# Patient Record
Sex: Male | Born: 1935 | Race: White | Hispanic: No | Smoking: Former smoker
Health system: Southern US, Community
[De-identification: ages and names within clinical notes are randomized; demographics above are authoritative.]

## PROBLEM LIST (undated history)

## (undated) DIAGNOSIS — K219 Gastro-esophageal reflux disease without esophagitis: Secondary | ICD-10-CM

## (undated) DIAGNOSIS — IMO0001 Reserved for inherently not codable concepts without codable children: Secondary | ICD-10-CM

## (undated) DIAGNOSIS — E785 Hyperlipidemia, unspecified: Secondary | ICD-10-CM

## (undated) DIAGNOSIS — N400 Enlarged prostate without lower urinary tract symptoms: Secondary | ICD-10-CM

## (undated) DIAGNOSIS — I493 Ventricular premature depolarization: Secondary | ICD-10-CM

## (undated) DIAGNOSIS — I10 Essential (primary) hypertension: Secondary | ICD-10-CM

## (undated) DIAGNOSIS — H9190 Unspecified hearing loss, unspecified ear: Secondary | ICD-10-CM

## (undated) HISTORY — PX: OTHER SURGICAL HISTORY: SHX169

## (undated) HISTORY — DX: Essential (primary) hypertension: I10

## (undated) HISTORY — DX: Benign prostatic hyperplasia without lower urinary tract symptoms: N40.0

## (undated) HISTORY — PX: CATARACT EXTRACTION, BILATERAL: SHX1313

## (undated) HISTORY — DX: Gastro-esophageal reflux disease without esophagitis: K21.9

## (undated) HISTORY — DX: Ventricular premature depolarization: I49.3

## (undated) HISTORY — PX: HAND SURGERY: SHX662

## (undated) HISTORY — DX: Reserved for inherently not codable concepts without codable children: IMO0001

## (undated) HISTORY — DX: Hyperlipidemia, unspecified: E78.5

## (undated) HISTORY — PX: TONSILLECTOMY: SUR1361

---

## 2012-01-24 DIAGNOSIS — R141 Gas pain: Secondary | ICD-10-CM | POA: Diagnosis not present

## 2012-01-24 DIAGNOSIS — Z86010 Personal history of colonic polyps: Secondary | ICD-10-CM | POA: Diagnosis not present

## 2012-01-24 DIAGNOSIS — R143 Flatulence: Secondary | ICD-10-CM | POA: Diagnosis not present

## 2012-01-24 DIAGNOSIS — R198 Other specified symptoms and signs involving the digestive system and abdomen: Secondary | ICD-10-CM | POA: Diagnosis not present

## 2012-01-28 DIAGNOSIS — R142 Eructation: Secondary | ICD-10-CM | POA: Diagnosis not present

## 2012-01-28 DIAGNOSIS — R143 Flatulence: Secondary | ICD-10-CM | POA: Diagnosis not present

## 2012-01-28 DIAGNOSIS — R141 Gas pain: Secondary | ICD-10-CM | POA: Diagnosis not present

## 2012-02-14 DIAGNOSIS — R198 Other specified symptoms and signs involving the digestive system and abdomen: Secondary | ICD-10-CM | POA: Diagnosis not present

## 2012-02-14 DIAGNOSIS — K219 Gastro-esophageal reflux disease without esophagitis: Secondary | ICD-10-CM | POA: Diagnosis not present

## 2012-02-14 DIAGNOSIS — R141 Gas pain: Secondary | ICD-10-CM | POA: Diagnosis not present

## 2012-02-14 DIAGNOSIS — Z86010 Personal history of colonic polyps: Secondary | ICD-10-CM | POA: Diagnosis not present

## 2012-03-02 DIAGNOSIS — H26499 Other secondary cataract, unspecified eye: Secondary | ICD-10-CM | POA: Diagnosis not present

## 2012-03-02 DIAGNOSIS — Z961 Presence of intraocular lens: Secondary | ICD-10-CM | POA: Diagnosis not present

## 2012-03-06 DIAGNOSIS — E7800 Pure hypercholesterolemia, unspecified: Secondary | ICD-10-CM | POA: Diagnosis not present

## 2012-03-06 DIAGNOSIS — K648 Other hemorrhoids: Secondary | ICD-10-CM | POA: Diagnosis not present

## 2012-03-06 DIAGNOSIS — K5730 Diverticulosis of large intestine without perforation or abscess without bleeding: Secondary | ICD-10-CM | POA: Diagnosis not present

## 2012-03-06 DIAGNOSIS — R198 Other specified symptoms and signs involving the digestive system and abdomen: Secondary | ICD-10-CM | POA: Diagnosis not present

## 2012-03-06 DIAGNOSIS — K644 Residual hemorrhoidal skin tags: Secondary | ICD-10-CM | POA: Diagnosis not present

## 2012-03-06 DIAGNOSIS — Z09 Encounter for follow-up examination after completed treatment for conditions other than malignant neoplasm: Secondary | ICD-10-CM | POA: Diagnosis not present

## 2012-03-06 DIAGNOSIS — N400 Enlarged prostate without lower urinary tract symptoms: Secondary | ICD-10-CM | POA: Diagnosis not present

## 2012-03-06 DIAGNOSIS — D126 Benign neoplasm of colon, unspecified: Secondary | ICD-10-CM | POA: Diagnosis not present

## 2012-03-06 DIAGNOSIS — Z860100 Personal history of colon polyps, unspecified: Secondary | ICD-10-CM | POA: Diagnosis not present

## 2012-03-06 DIAGNOSIS — K219 Gastro-esophageal reflux disease without esophagitis: Secondary | ICD-10-CM | POA: Diagnosis not present

## 2012-03-06 DIAGNOSIS — Z5181 Encounter for therapeutic drug level monitoring: Secondary | ICD-10-CM | POA: Diagnosis not present

## 2012-03-06 DIAGNOSIS — I10 Essential (primary) hypertension: Secondary | ICD-10-CM | POA: Diagnosis not present

## 2012-03-06 DIAGNOSIS — Z87891 Personal history of nicotine dependence: Secondary | ICD-10-CM | POA: Diagnosis not present

## 2012-05-29 DIAGNOSIS — I10 Essential (primary) hypertension: Secondary | ICD-10-CM | POA: Diagnosis not present

## 2012-05-29 DIAGNOSIS — Z111 Encounter for screening for respiratory tuberculosis: Secondary | ICD-10-CM | POA: Diagnosis not present

## 2012-05-29 DIAGNOSIS — E119 Type 2 diabetes mellitus without complications: Secondary | ICD-10-CM | POA: Diagnosis not present

## 2012-05-29 DIAGNOSIS — E782 Mixed hyperlipidemia: Secondary | ICD-10-CM | POA: Diagnosis not present

## 2012-06-01 DIAGNOSIS — I10 Essential (primary) hypertension: Secondary | ICD-10-CM | POA: Diagnosis not present

## 2012-06-01 DIAGNOSIS — Z0000 Encounter for general adult medical examination without abnormal findings: Secondary | ICD-10-CM | POA: Diagnosis not present

## 2012-06-01 DIAGNOSIS — E782 Mixed hyperlipidemia: Secondary | ICD-10-CM | POA: Diagnosis not present

## 2012-08-16 ENCOUNTER — Ambulatory Visit (INDEPENDENT_AMBULATORY_CARE_PROVIDER_SITE_OTHER): Payer: BC Managed Care – PPO | Admitting: Sports Medicine

## 2012-08-16 ENCOUNTER — Encounter: Payer: Self-pay | Admitting: Sports Medicine

## 2012-08-16 VITALS — BP 108/70 | HR 83 | Temp 98.3°F | Resp 18 | Ht 73.0 in | Wt 180.0 lb

## 2012-08-16 DIAGNOSIS — Z0000 Encounter for general adult medical examination without abnormal findings: Secondary | ICD-10-CM | POA: Insufficient documentation

## 2012-08-16 DIAGNOSIS — K219 Gastro-esophageal reflux disease without esophagitis: Secondary | ICD-10-CM

## 2012-08-16 DIAGNOSIS — R195 Other fecal abnormalities: Secondary | ICD-10-CM | POA: Insufficient documentation

## 2012-08-16 DIAGNOSIS — D7589 Other specified diseases of blood and blood-forming organs: Secondary | ICD-10-CM

## 2012-08-16 DIAGNOSIS — M109 Gout, unspecified: Secondary | ICD-10-CM

## 2012-08-16 DIAGNOSIS — R142 Eructation: Secondary | ICD-10-CM

## 2012-08-16 DIAGNOSIS — IMO0001 Reserved for inherently not codable concepts without codable children: Secondary | ICD-10-CM

## 2012-08-16 DIAGNOSIS — Z299 Encounter for prophylactic measures, unspecified: Secondary | ICD-10-CM

## 2012-08-16 DIAGNOSIS — E785 Hyperlipidemia, unspecified: Secondary | ICD-10-CM

## 2012-08-16 DIAGNOSIS — R141 Gas pain: Secondary | ICD-10-CM

## 2012-08-16 DIAGNOSIS — N400 Enlarged prostate without lower urinary tract symptoms: Secondary | ICD-10-CM

## 2012-08-16 MED ORDER — CHOLESTYRAMINE 4 G PO PACK: 1.0000 | PACK | Freq: Three times a day (TID) | ORAL | Status: DC

## 2012-08-16 MED ORDER — SILODOSIN 4 MG PO CAPS: 4.0000 mg | ORAL_CAPSULE | Freq: Every day | ORAL | Status: DC

## 2012-08-16 NOTE — Assessment & Plan Note (Signed)
With a mean corpuscular blood 100, and no anemia, I did recommend that he just use an over-the-counter multivitamin vitamin B 12 and folate. We can recheck this in a year.

## 2012-08-16 NOTE — Progress Notes (Signed)
Patient ID: Steven Pearson, male   DOB: [birth year], 78 y.o.   MRN: [id number] Subjective:    CC: Establish care.   HPI:  Obstructive urinary symptoms: He has a known history of benign prostatic hypertrophy, he currently takes Cialis for urination, however he is not currently on any alpha blockers. He does wake up multiple times at night to urinate, and is interested in medication to decrease this.  His most recent PSA was in the threes.  We did discuss the risks and benefits his PSA, and he does desire not to proceed with further PSA testing.  Gas: He has noted for the past few years but when he tries to void, he tends to have a large amount gas as well as some incontinence of stool.  There is no loose stools, they're formed. He denies any abdominal pain. He denies any fevers, chills, or changes in his diet. He has already been to 2 gastroenterologists, had a colonoscopy that was negative. He did some searching on the Internet and suspected yeast, and has recently been on a course of Diflucan. This was ineffective. At this point he is requesting to try Questran.  Hypertension, hyperlipidemia, gout: all stable.  Past medical history, Surgical history, Family history, Social history, Allergies, and medications have been entered into the medical record, reviewed, and no changes needed.   Review of Systems: No headache, visual changes, nausea, vomiting, diarrhea, constipation, dizziness, abdominal pain, skin rash, fevers, chills, night sweats, weight loss, chest pain, body aches, joint swelling, muscle aches, or shortness of breath.   Objective:    General: Well Developed, well nourished, and in no acute distress.  Neuro: Alert and oriented x3, extra-ocular muscles intact.  HEENT: Normocephalic, atraumatic, pupils equal round reactive to light, neck supple, no masses, no lymphadenopathy, thyroid nonpalpable.  Skin: Warm and dry, no rashes noted.  Cardiac: Regular rate and rhythm, no murmurs rubs or  gallops.  Respiratory: Clear to auscultation bilaterally. Not using accessory muscles, speaking in full sentences.  Abdominal: Soft, nontender, nondistended, positive bowel sounds, no masses, no organomegaly.  Musculoskeletal: Shoulder, elbow, wrist, hip, knee, ankle stable, and with full range of motion.   Impression and Recommendations:

## 2012-08-16 NOTE — Assessment & Plan Note (Signed)
Lipids well controlled on Lipitor. We can recheck this in a year, or at his leisure.

## 2012-08-16 NOTE — Assessment & Plan Note (Signed)
He come back to see me in 4 weeks, at that point we can do his welcome to Medicare visit with a complete physical. He is up-to-date on his colonoscopy, lipids, CBC, metabolic panel are all unremarkable.

## 2012-08-16 NOTE — Assessment & Plan Note (Signed)
Patient desires to try Questran before any other therapy. I think this is acceptable. Second course would be hyoscyamine, and simethicone.

## 2012-08-16 NOTE — Assessment & Plan Note (Signed)
Most recent uric acid level was 5.8 on febuxostat. No changes needed, handicap placard given for use during flares.

## 2012-08-16 NOTE — Assessment & Plan Note (Signed)
Continue Cialis. I would like to add Rapaflo. We have decided that we will not proceed with further PSA testing.

## 2012-08-16 NOTE — Assessment & Plan Note (Signed)
Continue current proton pump inhibitor. No changes needed.

## 2012-09-20 ENCOUNTER — Ambulatory Visit (INDEPENDENT_AMBULATORY_CARE_PROVIDER_SITE_OTHER): Payer: BC Managed Care – PPO

## 2012-09-20 ENCOUNTER — Ambulatory Visit (INDEPENDENT_AMBULATORY_CARE_PROVIDER_SITE_OTHER): Payer: Medicare Other | Admitting: Sports Medicine

## 2012-09-20 ENCOUNTER — Encounter: Payer: Self-pay | Admitting: Sports Medicine

## 2012-09-20 DIAGNOSIS — Z0000 Encounter for general adult medical examination without abnormal findings: Secondary | ICD-10-CM

## 2012-09-20 DIAGNOSIS — I771 Stricture of artery: Secondary | ICD-10-CM | POA: Diagnosis not present

## 2012-09-20 DIAGNOSIS — Z87891 Personal history of nicotine dependence: Secondary | ICD-10-CM | POA: Diagnosis not present

## 2012-09-20 DIAGNOSIS — R062 Wheezing: Secondary | ICD-10-CM

## 2012-09-20 DIAGNOSIS — Z23 Encounter for immunization: Secondary | ICD-10-CM

## 2012-09-20 DIAGNOSIS — Z9849 Cataract extraction status, unspecified eye: Secondary | ICD-10-CM

## 2012-09-20 DIAGNOSIS — IMO0001 Reserved for inherently not codable concepts without codable children: Secondary | ICD-10-CM

## 2012-09-20 DIAGNOSIS — Z85828 Personal history of other malignant neoplasm of skin: Secondary | ICD-10-CM

## 2012-09-20 DIAGNOSIS — N400 Enlarged prostate without lower urinary tract symptoms: Secondary | ICD-10-CM

## 2012-09-20 MED ORDER — TERAZOSIN HCL 2 MG PO CAPS: 2.0000 mg | ORAL_CAPSULE | Freq: Every day | ORAL | Status: DC

## 2012-09-20 NOTE — Progress Notes (Signed)
Subjective:    Steven Pearson is a 78 y.o. male who presents for Medicare Annual/Subsequent preventive examination.   Preventive Screening-Counseling & Management  Tobacco History  Smoking status  . Former Smoker -- 1.5 packs/day for 45 years  . Types: Cigarettes  . Quit date: 08/17/1999  Smokeless tobacco  . Never Used    Problems Prior to Visit 1.   Current Problems (verified) Patient Active Problem List  Diagnosis  . BPH (benign prostatic hypertrophy)  . GERD (gastroesophageal reflux disease)  . Hyperlipidemia  . Gas  . Macrocytosis without anemia  . Preventive measure  . Gout    Medications Prior to Visit Current Outpatient Prescriptions on File Prior to Visit  Medication Sig Dispense Refill  . atorvastatin (LIPITOR) 20 MG tablet Take 20 mg by mouth daily.      . cholestyramine (QUESTRAN) 4 G packet Take 1 packet by mouth 3 (three) times daily with meals.  60 each  12  . febuxostat (ULORIC) 40 MG tablet Take 80 mg by mouth daily.      . hydrochlorothiazide (MICROZIDE) 12.5 MG capsule       . lisinopril (PRINIVIL,ZESTRIL) 2.5 MG tablet Take 2.5 mg by mouth daily.      Marland Kitchen omeprazole (PRILOSEC) 20 MG capsule Take 20 mg by mouth daily.      . Probiotic Product (PROBIOTIC DAILY PO) Take by mouth.      . silodosin (RAPAFLO) 4 MG CAPS capsule Take 1 capsule (4 mg total) by mouth daily with breakfast.  90 capsule  0  . tadalafil (CIALIS) 5 MG tablet Take 5 mg by mouth daily as needed.        Current Medications (verified) Current Outpatient Prescriptions  Medication Sig Dispense Refill  . atorvastatin (LIPITOR) 20 MG tablet Take 20 mg by mouth daily.      . cholestyramine (QUESTRAN) 4 G packet Take 1 packet by mouth 3 (three) times daily with meals.  60 each  12  . febuxostat (ULORIC) 40 MG tablet Take 80 mg by mouth daily.      . hydrochlorothiazide (MICROZIDE) 12.5 MG capsule       . lisinopril (PRINIVIL,ZESTRIL) 2.5 MG tablet Take 2.5 mg by mouth daily.      Marland Kitchen  omeprazole (PRILOSEC) 20 MG capsule Take 20 mg by mouth daily.      . Probiotic Product (PROBIOTIC DAILY PO) Take by mouth.      . silodosin (RAPAFLO) 4 MG CAPS capsule Take 1 capsule (4 mg total) by mouth daily with breakfast.  90 capsule  0  . tadalafil (CIALIS) 5 MG tablet Take 5 mg by mouth daily as needed.         Allergies (verified) Review of patient's allergies indicates no known allergies.   PAST HISTORY  Family History No family history on file.  Social History History  Substance Use Topics  . Smoking status: Former Smoker -- 1.5 packs/day for 45 years    Types: Cigarettes    Quit date: 08/17/1999  . Smokeless tobacco: Never Used  . Alcohol Use: 3.0 oz/week    5 Glasses of wine per week    Are there smokers in your home (other than you)?  No  Risk Factors Current exercise habits: Regular stretching, at home, and in the gym.  Dietary issues discussed: Yes   Cardiac risk factors: advanced age (older than 43 for men, 65 for women).  Depression Screen (Note: if answer to either of the following is "Yes", a  more complete depression screening is indicated)   Q1: Over the past two weeks, have you felt down, depressed or hopeless? No  Q2: Over the past two weeks, have you felt little interest or pleasure in doing things? No  Have you lost interest or pleasure in daily life? No  Do you often feel hopeless? No  Do you cry easily over simple problems? No  Activities of Daily Living In your present state of health, do you have any difficulty performing the following activities?:  Driving? No Managing money?  No Feeding yourself? No Getting from bed to chair? No Climbing a flight of stairs? No Preparing food and eating?: No Bathing or showering? No Getting dressed: No Getting to the toilet? No Using the toilet:No Moving around from place to place: No In the past year have you fallen or had a near fall?:No   Are you sexually active?  No  Do you have more than one  partner?  No  Hearing Difficulties: Yes Do you often ask people to speak up or repeat themselves? No Do you experience ringing or noises in your ears? No Do you have difficulty understanding soft or whispered voices? Yes   Do you feel that you have a problem with memory? No  Do you often misplace items? No  Do you feel safe at home?  Yes  Cognitive Testing  Alert? Yes  Normal Appearance?Yes  Oriented to person? Yes  Place? Yes   Time? Yes  Recall of three objects?  Yes  Can perform simple calculations? Yes  Displays appropriate judgment?Yes  Can read the correct time from a watch face?Yes   Advanced Directives have been discussed with the patient? No   List the Names of Other Physician/Practitioners you currently use: 1.    Indicate any recent Medical Services you may have received from other than [facility] providers in the past year (date may be approximate).   There is no immunization history on file for this patient.  Screening Tests Health Maintenance  Topic Date Due  . Pneumococcal Polysaccharide Vaccine Age 20 And Over  10/20/1999  . Influenza Vaccine  08/27/2012  . Colonoscopy  12/27/2016  . Tetanus/tdap  11/26/2019  . Zostavax  Addressed    All answers were reviewed with the patient and necessary referrals were made:  Rodney Langton, MD   09/20/2012   History reviewed: allergies, current medications, past family history, past medical history, past social history, past surgical history and problem list  Review of Systems A comprehensive review of systems was negative.  except as noted above in the history of present illness   Objective:     Vision by Snellen chart: right eye:20/25, left eye:20/25   General Appearance:    Alert, cooperative, no distress, appears stated age  Head:    Normocephalic, without obvious abnormality, atraumatic  Eyes:    PERRL, conjunctiva/corneas clear, EOM's intact   Ears:    Normal external ear canals, both ears  Nose:   Nares  normal, septum midline, mucosa normal, no drainage    or sinus tenderness  Throat:   Lips, mucosa, and tongue normal; teeth and gums normal  Neck:   Supple, symmetrical, trachea midline, no adenopathy;       thyroid:  No enlargement/tenderness/nodules; no carotid   bruit or JVD  Back:     Symmetric, no curvature, ROM normal, no CVA tenderness  Lungs:     inspiratory wheeze noted in right lung field.   Chest wall:  No tenderness or deformity  Heart:    Regular rate and rhythm, S1 and S2 normal, no murmur, rub   or gallop  Abdomen:     Soft, non-tender, bowel sounds active all four quadrants,    no masses, no organomegaly        Extremities:   Extremities normal, atraumatic, no cyanosis or edema  Pulses:   2+ and symmetric all extremities  Skin:   Skin color, texture, turgor normal, no rashes or lesions  Lymph nodes:   Cervical, supraclavicular, and axillary nodes normal  Neurologic:   CNII-XII intact. Normal strength, sensation and reflexes      throughout       Assessment:     1. Normal adult male.     Plan:     During the course of the visit the patient was educated and counseled about appropriate screening and preventive services including:    Pneumococcal vaccine   Influenza vaccine  Screening electrocardiogram  Diet review for nutrition referral? Yes ____  Not Indicated _x___   Patient Instructions (the written plan) was given to the patient.  Medicare Attestation I have personally reviewed: The patient's medical and social history Their use of alcohol, tobacco or illicit drugs Their current medications and supplements The patient's functional ability including ADLs,fall risks, home safety risks, cognitive, and hearing and visual impairment Diet and physical activities Evidence for depression or mood disorders  The patient's weight, height, BMI, and visual acuity have been recorded in the chart.  I have made referrals, counseling, and provided education to  the patient based on review of the above and I have provided the patient with a written personalized care plan for preventive services.     Rodney Langton, MD   09/20/2012

## 2012-09-20 NOTE — Addendum Note (Signed)
Addended by: Chalmers Cater on: 09/20/2012 10:52 AM   Modules accepted: Orders

## 2012-09-20 NOTE — Assessment & Plan Note (Signed)
With a long history of smoking, we'll obtain a chest x-ray. This is asymptomatic.

## 2012-09-20 NOTE — Progress Notes (Deleted)
  Subjective:    Samie Reasons is a 78 y.o. male who presents for a welcome to Medicare exam.   Cardiac risk factors: advanced age (older than 55 for men, 22 for women).  Depression Screen (Note: if answer to either of the following is "Yes", a more complete depression screening is indicated)  Q1: Over the past two weeks, have you felt down, depressed or hopeless? no Q2: Over the past two weeks, have you felt little interest or pleasure in doing things? no  Activities of Daily Living In your present state of health, do you have any difficulty performing the following activities?:  Preparing food and eating?: No Bathing yourself: No Getting dressed: No Using the toilet:No Moving around from place to place: No In the past year have you fallen or had a near fall?:No  Current exercise habits: Home exercise routine includes stretching and weights. Gym/ health club routine includes .  Dietary issues discussed: Yes  Hearing difficulties: Yes Safe in current home environment: yes    Review of Systems A comprehensive review of systems was negative.    Objective:     Vision by Snellen chart: right eye:{vision:19455::"20/20"}, left eye:{vision:19455::"20/20"} There were no vitals taken for this visit. There is no height or weight on file to calculate BMI. {Exam, WUJW:11914}    Assessment:    ***     Plan:     During the course of the visit the patient was educated and counseled about appropriate screening and preventive services including:   {plan:19837}  Patient Instructions (the written plan) was given to the patient.

## 2012-09-20 NOTE — Assessment & Plan Note (Signed)
Doing well with Questran.

## 2012-09-20 NOTE — Assessment & Plan Note (Signed)
Improved significantly with Rapaflo Patient does desire generic alternative Will switch to Terazosin.

## 2012-09-21 ENCOUNTER — Encounter: Payer: Self-pay | Admitting: *Deleted

## 2012-09-27 DIAGNOSIS — L820 Inflamed seborrheic keratosis: Secondary | ICD-10-CM | POA: Diagnosis not present

## 2012-09-27 DIAGNOSIS — L821 Other seborrheic keratosis: Secondary | ICD-10-CM | POA: Diagnosis not present

## 2012-09-27 DIAGNOSIS — Z85828 Personal history of other malignant neoplasm of skin: Secondary | ICD-10-CM | POA: Diagnosis not present

## 2012-09-29 ENCOUNTER — Encounter: Payer: Self-pay | Admitting: Sports Medicine

## 2012-10-11 DIAGNOSIS — H1810 Bullous keratopathy, unspecified eye: Secondary | ICD-10-CM | POA: Diagnosis not present

## 2012-10-11 DIAGNOSIS — H538 Other visual disturbances: Secondary | ICD-10-CM | POA: Diagnosis not present

## 2012-10-11 DIAGNOSIS — H4910 Fourth [trochlear] nerve palsy, unspecified eye: Secondary | ICD-10-CM | POA: Diagnosis not present

## 2012-10-11 DIAGNOSIS — H26499 Other secondary cataract, unspecified eye: Secondary | ICD-10-CM | POA: Diagnosis not present

## 2012-10-11 DIAGNOSIS — D3130 Benign neoplasm of unspecified choroid: Secondary | ICD-10-CM | POA: Diagnosis not present

## 2012-10-25 ENCOUNTER — Telehealth: Payer: Self-pay | Admitting: Sports Medicine

## 2012-10-26 ENCOUNTER — Other Ambulatory Visit: Payer: Self-pay | Admitting: Sports Medicine

## 2012-10-26 MED ORDER — COLESTIPOL HCL 1 G PO TABS: 3.0000 g | ORAL_TABLET | Freq: Two times a day (BID) | ORAL | Status: DC

## 2012-11-30 DIAGNOSIS — H532 Diplopia: Secondary | ICD-10-CM | POA: Diagnosis not present

## 2012-11-30 DIAGNOSIS — H4910 Fourth [trochlear] nerve palsy, unspecified eye: Secondary | ICD-10-CM | POA: Diagnosis not present

## 2012-12-01 ENCOUNTER — Encounter: Payer: Self-pay | Admitting: Sports Medicine

## 2012-12-01 ENCOUNTER — Other Ambulatory Visit: Payer: Self-pay | Admitting: *Deleted

## 2012-12-01 MED ORDER — OMEPRAZOLE 20 MG PO CPDR: 20.0000 mg | DELAYED_RELEASE_CAPSULE | Freq: Every day | ORAL | Status: DC

## 2012-12-05 ENCOUNTER — Ambulatory Visit (INDEPENDENT_AMBULATORY_CARE_PROVIDER_SITE_OTHER): Payer: BC Managed Care – PPO | Admitting: Sports Medicine

## 2012-12-05 ENCOUNTER — Encounter: Payer: Self-pay | Admitting: Sports Medicine

## 2012-12-05 ENCOUNTER — Other Ambulatory Visit: Payer: Self-pay | Admitting: Sports Medicine

## 2012-12-05 VITALS — BP 135/90 | HR 81 | Wt 190.0 lb

## 2012-12-05 DIAGNOSIS — R143 Flatulence: Secondary | ICD-10-CM | POA: Diagnosis not present

## 2012-12-05 DIAGNOSIS — R5383 Other fatigue: Secondary | ICD-10-CM | POA: Insufficient documentation

## 2012-12-05 DIAGNOSIS — M109 Gout, unspecified: Secondary | ICD-10-CM

## 2012-12-05 DIAGNOSIS — R141 Gas pain: Secondary | ICD-10-CM | POA: Diagnosis not present

## 2012-12-05 DIAGNOSIS — IMO0001 Reserved for inherently not codable concepts without codable children: Secondary | ICD-10-CM

## 2012-12-05 DIAGNOSIS — E785 Hyperlipidemia, unspecified: Secondary | ICD-10-CM

## 2012-12-05 DIAGNOSIS — R5381 Other malaise: Secondary | ICD-10-CM

## 2012-12-05 DIAGNOSIS — N400 Enlarged prostate without lower urinary tract symptoms: Secondary | ICD-10-CM

## 2012-12-05 DIAGNOSIS — D7589 Other specified diseases of blood and blood-forming organs: Secondary | ICD-10-CM

## 2012-12-05 MED ORDER — TERAZOSIN HCL 5 MG PO CAPS: 5.0000 mg | ORAL_CAPSULE | Freq: Every day | ORAL | Status: DC

## 2012-12-05 NOTE — Assessment & Plan Note (Signed)
Recheck lipids

## 2012-12-05 NOTE — Progress Notes (Addendum)
Subjective:    CC: Followup  HPI: Arrow has multiple things he would like to discuss.  Change in stools: Again, he notes that he is a large amount of gas, as well as mucus in his stool. He denies any pain, denies any diarrhea. He is doing well at the bilateral binding resin, but wonders if we can check some further blood work. He has a list of tests that he would like checked.  Macrocytosis without anemia: Has been doing vitamin B 12 and folate over-the-counter vitamins, would like his CBC rechecked.  Gout: Due for a recheck on uric acid levels.  Fatigue: Would like testosterone levels checked.  Obstructive uropathy: Did extremely well with Terazosin, is wondering if we can increase the dose.  Past medical history, Surgical history, Family history, Social history, Allergies, and medications have been entered into the medical record, reviewed, and no changes needed.   Review of Systems: No fevers, chills, night sweats, weight loss, chest pain, or shortness of breath.   Objective:    General: Well Developed, well nourished, and in no acute distress.  Neuro: Alert and oriented x3, extra-ocular muscles intact.  HEENT: Normocephalic, atraumatic, pupils equal round reactive to light, neck supple, no masses, no lymphadenopathy, thyroid nonpalpable.  Skin: Warm and dry, no rashes. Cardiac: Regular rate and rhythm, no murmurs rubs or gallops.  Respiratory: Clear to auscultation bilaterally. Not using accessory muscles, speaking in full sentences.  Impression and Recommendations:   I spent over 40 minutes with this patient, greater than 50% was face-to-face counseling for multiple medical problems including change in stool, preventive measures, lipids, gout, fatigue.Marland Kitchen

## 2012-12-05 NOTE — Assessment & Plan Note (Signed)
Checking PSA. Increase terazosin to 5mg .

## 2012-12-05 NOTE — Assessment & Plan Note (Signed)
Rechecking CBC

## 2012-12-05 NOTE — Assessment & Plan Note (Signed)
Doing better with Bile acid Binding resins. Checking some stool studies.

## 2012-12-05 NOTE — Assessment & Plan Note (Signed)
See below for specific blood work

## 2012-12-05 NOTE — Assessment & Plan Note (Signed)
Rechecking uric acid

## 2012-12-06 LAB — CBC
HCT: 41 % (ref 39.0–52.0)
Hemoglobin: 13.6 g/dL (ref 13.0–17.0)
MCH: 31.7 pg (ref 26.0–34.0)
MCHC: 33.2 g/dL (ref 30.0–36.0)
MCV: 95.6 fL (ref 78.0–100.0)
Platelets: 174 10*3/uL (ref 150–400)
RBC: 4.29 MIL/uL (ref 4.22–5.81)
RDW: 13.6 % (ref 11.5–15.5)
WBC: 5.8 10*3/uL (ref 4.0–10.5)

## 2012-12-06 LAB — PSA, TOTAL AND FREE
PSA, Free Pct: 31 % (ref >25)
PSA, Free: 1.61 ng/mL
PSA: 5.25 ng/mL — ABNORMAL HIGH (ref <=4.00)

## 2012-12-06 LAB — COMPREHENSIVE METABOLIC PANEL
ALT: 15 U/L (ref 0–53)
AST: 20 U/L (ref 0–37)
Albumin: 4.4 g/dL (ref 3.5–5.2)
Alkaline Phosphatase: 49 U/L (ref 39–117)
BUN: 23 mg/dL (ref 6–23)
CO2: 28 mEq/L (ref 19–32)
Calcium: 9.2 mg/dL (ref 8.4–10.5)
Chloride: 100 mEq/L (ref 96–112)
Creat: 1.28 mg/dL (ref 0.50–1.35)
Glucose, Bld: 92 mg/dL (ref 70–99)
Potassium: 4.2 mEq/L (ref 3.5–5.3)
Sodium: 143 mEq/L (ref 135–145)
Total Bilirubin: 0.9 mg/dL (ref 0.3–1.2)
Total Protein: 6.7 g/dL (ref 6.0–8.3)

## 2012-12-06 LAB — HIGH SENSITIVITY CRP: CRP, High Sensitivity: 0.7 mg/L

## 2012-12-06 LAB — TESTOSTERONE, FREE, TOTAL, SHBG
Sex Hormone Binding: 60 nmol/L (ref 13–71)
Testosterone, Free: 62.5 pg/mL (ref 47.0–244.0)
Testosterone-% Free: 1.4 % — ABNORMAL LOW (ref 1.6–2.9)
Testosterone: 454.76 ng/dL (ref 300–890)

## 2012-12-06 LAB — GLIA (IGA/G) + TTG IGA
Deamidated Gliadin Abs, IgG: 5 U/mL (ref <20)
Gliadin IgA: 2.9 U/mL (ref <20)
Tissue Transglutaminase Ab, IgA: 3 U/mL (ref <20)

## 2012-12-06 LAB — TISSUE TRANSGLUTAMINASE, IGG: t-Transglutaminase (tTG) IgG: 7.7 U/mL (ref <20)

## 2012-12-06 LAB — LIPID PANEL
Cholesterol: 126 mg/dL (ref 0–200)
HDL: 58 mg/dL (ref >39)
LDL Cholesterol: 58 mg/dL (ref 0–99)
Total CHOL/HDL Ratio: 2.2 Ratio
Triglycerides: 48 mg/dL (ref <150)
VLDL: 10 mg/dL (ref 0–40)

## 2012-12-06 LAB — LIPASE: Lipase: 31 U/L (ref 0–75)

## 2012-12-06 LAB — ANCA SCREEN W REFLEX TITER
Atypical p-ANCA Screen: NEGATIVE
c-ANCA Screen: NEGATIVE
p-ANCA Screen: NEGATIVE

## 2012-12-06 LAB — TSH: TSH: 1.7 u[IU]/mL (ref 0.350–4.500)

## 2012-12-06 LAB — URIC ACID: Uric Acid, Serum: 5 mg/dL (ref 4.0–7.8)

## 2012-12-06 LAB — H. PYLORI ANTIBODY, IGG: H Pylori IgG: 0.57 {ISR}

## 2012-12-06 LAB — HEMOGLOBIN A1C
Hgb A1c MFr Bld: 5.6 % (ref <5.7)
Mean Plasma Glucose: 114 mg/dL (ref <117)

## 2012-12-06 LAB — SODIUM, STOOL

## 2012-12-06 LAB — OSMOLALITY, STOOL

## 2012-12-09 LAB — E. HISTOLYTICA ANTIBODY (AMOEBA AB)

## 2012-12-14 DIAGNOSIS — R142 Eructation: Secondary | ICD-10-CM | POA: Diagnosis not present

## 2012-12-14 DIAGNOSIS — R141 Gas pain: Secondary | ICD-10-CM | POA: Diagnosis not present

## 2012-12-15 LAB — GIARDIA ANTIGEN: Giardia Screen (EIA): NEGATIVE

## 2012-12-15 LAB — OVA AND PARASITE SCREEN: OP: NONE SEEN

## 2012-12-18 LAB — STOOL CULTURE

## 2012-12-18 LAB — CLOSTRIDIUM DIFFICILE EIA: CDIFTX: NEGATIVE

## 2012-12-28 ENCOUNTER — Ambulatory Visit (INDEPENDENT_AMBULATORY_CARE_PROVIDER_SITE_OTHER): Payer: BC Managed Care – PPO | Admitting: Sports Medicine

## 2012-12-28 VITALS — BP 132/79 | HR 72 | Wt 192.0 lb

## 2012-12-28 DIAGNOSIS — N400 Enlarged prostate without lower urinary tract symptoms: Secondary | ICD-10-CM | POA: Diagnosis not present

## 2012-12-28 DIAGNOSIS — M109 Gout, unspecified: Secondary | ICD-10-CM

## 2012-12-28 DIAGNOSIS — R195 Other fecal abnormalities: Secondary | ICD-10-CM

## 2012-12-28 DIAGNOSIS — E785 Hyperlipidemia, unspecified: Secondary | ICD-10-CM

## 2012-12-28 NOTE — Assessment & Plan Note (Signed)
Coming off of lipitor, recheck in 6 weeks.

## 2012-12-28 NOTE — Assessment & Plan Note (Signed)
Uric acid is excellent at 5. No changes.

## 2012-12-28 NOTE — Assessment & Plan Note (Signed)
This may in fact be related to a yeast overgrowth. Thankfully all of his testing is negative, there is no life-threatening process. He has had a very good response to colestipol. At this point we should not change his regimen.

## 2012-12-28 NOTE — Progress Notes (Signed)
Subjective:    CC: Follow up  HPI: Excess flatulence: Greatly improved with colestipol, workup has been negative.  Hyperlipidemia: Very well controlled, patient does desire to go off Lipitor.  Benign prostatic hypertrophy: symptoms greatly improved with Hytrin. PSA was slightly elevated, free PSA was normal.  Doubt: Controlled, no flares, uric acid normal.  Past medical history, Surgical history, Family history, Social history, Allergies, and medications have been entered into the medical record, reviewed, and no changes needed.   Review of Systems: No fevers, chills, night sweats, weight loss, chest pain, or shortness of breath.   Objective:    General: Well Developed, well nourished, and in no acute distress.  Neuro: Alert and oriented x3, extra-ocular muscles intact.  HEENT: Normocephalic, atraumatic, pupils equal round reactive to light, neck supple, no masses, no lymphadenopathy, thyroid nonpalpable.  Skin: Warm and dry, no rashes. Cardiac: Regular rate and rhythm, no murmurs rubs or gallops.  Respiratory: Clear to auscultation bilaterally. Not using accessory muscles, speaking in full sentences.  Impression and Recommendations:

## 2012-12-28 NOTE — Assessment & Plan Note (Signed)
Symptoms improved with terazosin. Rechecking PSA in 6 weeks to assess velocity.

## 2013-01-04 ENCOUNTER — Other Ambulatory Visit: Payer: Self-pay | Admitting: *Deleted

## 2013-01-04 ENCOUNTER — Encounter: Payer: Self-pay | Admitting: Sports Medicine

## 2013-01-04 MED ORDER — HYDROCHLOROTHIAZIDE 12.5 MG PO CAPS: 12.5000 mg | ORAL_CAPSULE | Freq: Every day | ORAL | Status: DC

## 2013-02-22 ENCOUNTER — Encounter: Payer: Self-pay | Admitting: Sports Medicine

## 2013-02-22 ENCOUNTER — Telehealth: Payer: Self-pay | Admitting: *Deleted

## 2013-02-22 MED ORDER — FEBUXOSTAT 40 MG PO TABS: 40.0000 mg | ORAL_TABLET | Freq: Every day | ORAL | Status: DC

## 2013-02-22 MED ORDER — FEBUXOSTAT 40 MG PO TABS: 80.0000 mg | ORAL_TABLET | Freq: Every day | ORAL | Status: DC

## 2013-02-28 ENCOUNTER — Encounter: Payer: Self-pay | Admitting: Sports Medicine

## 2013-02-28 MED ORDER — HYDROCHLOROTHIAZIDE 12.5 MG PO CAPS: 12.5000 mg | ORAL_CAPSULE | Freq: Two times a day (BID) | ORAL | Status: DC

## 2013-05-07 ENCOUNTER — Encounter: Payer: Self-pay | Admitting: Sports Medicine

## 2013-05-07 MED ORDER — TADALAFIL 5 MG PO TABS: 5.0000 mg | ORAL_TABLET | Freq: Every day | ORAL | Status: DC

## 2013-06-11 ENCOUNTER — Encounter: Payer: Self-pay | Admitting: Sports Medicine

## 2013-06-11 DIAGNOSIS — M109 Gout, unspecified: Secondary | ICD-10-CM

## 2013-06-11 DIAGNOSIS — M899 Disorder of bone, unspecified: Secondary | ICD-10-CM

## 2013-06-11 DIAGNOSIS — Z79899 Other long term (current) drug therapy: Secondary | ICD-10-CM

## 2013-06-11 DIAGNOSIS — N400 Enlarged prostate without lower urinary tract symptoms: Secondary | ICD-10-CM

## 2013-06-11 DIAGNOSIS — I10 Essential (primary) hypertension: Secondary | ICD-10-CM

## 2013-06-11 DIAGNOSIS — Z299 Encounter for prophylactic measures, unspecified: Secondary | ICD-10-CM

## 2013-06-12 MED ORDER — TERAZOSIN HCL 10 MG PO CAPS: 10.0000 mg | ORAL_CAPSULE | Freq: Every day | ORAL | Status: DC

## 2013-06-12 NOTE — Telephone Encounter (Signed)
Medicare will not pay for the vitamin D or the CRP

## 2013-06-14 ENCOUNTER — Encounter: Payer: Self-pay | Admitting: Sports Medicine

## 2013-06-14 DIAGNOSIS — N400 Enlarged prostate without lower urinary tract symptoms: Secondary | ICD-10-CM

## 2013-06-14 DIAGNOSIS — Z125 Encounter for screening for malignant neoplasm of prostate: Secondary | ICD-10-CM | POA: Diagnosis not present

## 2013-06-22 DIAGNOSIS — Z299 Encounter for prophylactic measures, unspecified: Secondary | ICD-10-CM | POA: Diagnosis not present

## 2013-06-22 DIAGNOSIS — M109 Gout, unspecified: Secondary | ICD-10-CM | POA: Diagnosis not present

## 2013-06-22 DIAGNOSIS — Z79899 Other long term (current) drug therapy: Secondary | ICD-10-CM | POA: Diagnosis not present

## 2013-06-22 DIAGNOSIS — Z125 Encounter for screening for malignant neoplasm of prostate: Secondary | ICD-10-CM | POA: Diagnosis not present

## 2013-06-22 DIAGNOSIS — I10 Essential (primary) hypertension: Secondary | ICD-10-CM | POA: Diagnosis not present

## 2013-06-22 LAB — CBC
HCT: 37.5 % — ABNORMAL LOW (ref 39.0–52.0)
Hemoglobin: 13.1 g/dL (ref 13.0–17.0)
MCH: 33.2 pg (ref 26.0–34.0)
MCHC: 34.9 g/dL (ref 30.0–36.0)
MCV: 94.9 fL (ref 78.0–100.0)
Platelets: 173 10*3/uL (ref 150–400)
RBC: 3.95 MIL/uL — ABNORMAL LOW (ref 4.22–5.81)
RDW: 12.8 % (ref 11.5–15.5)
WBC: 4.4 10*3/uL (ref 4.0–10.5)

## 2013-06-22 LAB — LIPID PANEL
Cholesterol: 164 mg/dL (ref 0–200)
HDL: 58 mg/dL (ref >39)
LDL Cholesterol: 88 mg/dL (ref 0–99)
Total CHOL/HDL Ratio: 2.8 Ratio
Triglycerides: 90 mg/dL (ref <150)
VLDL: 18 mg/dL (ref 0–40)

## 2013-06-22 LAB — HEMOGLOBIN A1C
Hgb A1c MFr Bld: 5.5 % (ref <5.7)
Mean Plasma Glucose: 111 mg/dL (ref <117)

## 2013-06-22 LAB — COMPREHENSIVE METABOLIC PANEL
ALT: 12 U/L (ref 0–53)
AST: 19 U/L (ref 0–37)
Albumin: 4.1 g/dL (ref 3.5–5.2)
Alkaline Phosphatase: 42 U/L (ref 39–117)
BUN: 17 mg/dL (ref 6–23)
CO2: 28 mEq/L (ref 19–32)
Calcium: 9 mg/dL (ref 8.4–10.5)
Chloride: 103 mEq/L (ref 96–112)
Creat: 1.22 mg/dL (ref 0.50–1.35)
Glucose, Bld: 92 mg/dL (ref 70–99)
Potassium: 4.6 mEq/L (ref 3.5–5.3)
Sodium: 139 mEq/L (ref 135–145)
Total Bilirubin: 0.9 mg/dL (ref 0.3–1.2)
Total Protein: 6.4 g/dL (ref 6.0–8.3)

## 2013-06-22 LAB — URIC ACID: Uric Acid, Serum: 6.2 mg/dL (ref 4.0–7.8)

## 2013-06-22 LAB — PSA, TOTAL AND FREE
PSA, Free Pct: 26 % (ref >25)
PSA, Free: 1.5 ng/mL
PSA: 5.83 ng/mL — ABNORMAL HIGH (ref <=4.00)

## 2013-06-22 LAB — TSH: TSH: 1.866 u[IU]/mL (ref 0.350–4.500)

## 2013-06-22 NOTE — Addendum Note (Signed)
Addended by: Monica Becton on: 06/22/2013 10:18 PM   Modules accepted: Orders

## 2013-06-22 NOTE — Assessment & Plan Note (Signed)
With an increasing PSA, referral to urology for assistance in management and possible biopsy.

## 2013-06-25 LAB — TESTOSTERONE, FREE, TOTAL, SHBG
Sex Hormone Binding: 52 nmol/L (ref 13–71)
Testosterone, Free: 53.9 pg/mL (ref 47.0–244.0)
Testosterone-% Free: 1.5 % — ABNORMAL LOW (ref 1.6–2.9)
Testosterone: 363 ng/dL (ref 300–890)

## 2013-07-04 ENCOUNTER — Encounter: Payer: Self-pay | Admitting: Sports Medicine

## 2013-07-04 ENCOUNTER — Ambulatory Visit (INDEPENDENT_AMBULATORY_CARE_PROVIDER_SITE_OTHER): Payer: Medicare Other | Admitting: Sports Medicine

## 2013-07-04 VITALS — BP 114/72 | HR 68 | Wt 187.0 lb

## 2013-07-04 DIAGNOSIS — Z299 Encounter for prophylactic measures, unspecified: Secondary | ICD-10-CM

## 2013-07-04 DIAGNOSIS — Z0000 Encounter for general adult medical examination without abnormal findings: Secondary | ICD-10-CM

## 2013-07-04 DIAGNOSIS — N400 Enlarged prostate without lower urinary tract symptoms: Secondary | ICD-10-CM

## 2013-07-04 DIAGNOSIS — E785 Hyperlipidemia, unspecified: Secondary | ICD-10-CM | POA: Diagnosis not present

## 2013-07-04 DIAGNOSIS — R195 Other fecal abnormalities: Secondary | ICD-10-CM | POA: Diagnosis not present

## 2013-07-04 NOTE — Assessment & Plan Note (Signed)
Continue to be well controlled with current medication regimen. No changes.

## 2013-07-04 NOTE — Progress Notes (Addendum)
  Subjective:    CC: Complete physical exam.  HPI: Steven Pearson is doing very well and has no complaints. Tylan is here for his complete physical.  Past medical history, Surgical history, Family history not pertinant except as noted below, Social history, Allergies, and medications have been entered into the medical record, reviewed, and no changes needed.   Review of Systems: No headache, visual changes, nausea, vomiting, diarrhea, constipation, dizziness, abdominal pain, skin rash, fevers, chills, night sweats, swollen lymph nodes, weight loss, chest pain, body aches, joint swelling, muscle aches, shortness of breath, mood changes, visual or auditory hallucinations.  Objective:    General: Well Developed, well nourished, and in no acute distress.  Neuro: Alert and oriented x3, extra-ocular muscles intact, sensation grossly intact.  HEENT: Normocephalic, atraumatic, pupils equal round reactive to light, neck supple, no masses, no lymphadenopathy, thyroid nonpalpable. Skin: Warm and dry, no rashes noted.  Cardiac: Regular rate and rhythm, no murmurs rubs or gallops.  Respiratory: Clear to auscultation bilaterally. Not using accessory muscles, speaking in full sentences.  Abdominal: Soft, nontender, nondistended, positive bowel sounds, no masses, no organomegaly.  Musculoskeletal: Shoulder, elbow, wrist, hip, knee, ankle stable, and with full range of motion.  Impression and Recommendations:    The patient was counselled, risk factors were discussed, anticipatory guidance given.

## 2013-07-04 NOTE — Assessment & Plan Note (Signed)
Symptoms continue be well-controlled. No changes.

## 2013-07-04 NOTE — Assessment & Plan Note (Signed)
Controlled off the statin. No changes.

## 2013-07-04 NOTE — Assessment & Plan Note (Addendum)
Up-to-date on all screening measures. We had a long discussion regarding his increasing PSA, we are not going to pursue this further. He does want to recheck PSA in 1 year.

## 2013-08-01 ENCOUNTER — Encounter: Payer: Self-pay | Admitting: Sports Medicine

## 2013-08-01 ENCOUNTER — Other Ambulatory Visit: Payer: Self-pay

## 2013-08-01 MED ORDER — LISINOPRIL 2.5 MG PO TABS: 2.5000 mg | ORAL_TABLET | Freq: Every day | ORAL | Status: DC

## 2013-08-05 ENCOUNTER — Encounter: Payer: Self-pay | Admitting: Sports Medicine

## 2013-08-07 ENCOUNTER — Other Ambulatory Visit: Payer: Self-pay

## 2013-09-14 ENCOUNTER — Encounter: Payer: Self-pay | Admitting: Sports Medicine

## 2013-09-21 ENCOUNTER — Other Ambulatory Visit: Payer: Self-pay | Admitting: *Deleted

## 2013-09-21 MED ORDER — OMEPRAZOLE 20 MG PO CPDR: 20.0000 mg | DELAYED_RELEASE_CAPSULE | Freq: Every day | ORAL | Status: DC

## 2013-09-24 DIAGNOSIS — Z23 Encounter for immunization: Secondary | ICD-10-CM | POA: Diagnosis not present

## 2013-09-25 ENCOUNTER — Telehealth: Payer: Self-pay

## 2013-09-25 NOTE — Telephone Encounter (Signed)
ERROR

## 2013-10-07 ENCOUNTER — Encounter (HOSPITAL_COMMUNITY): Payer: Self-pay | Admitting: Emergency Medicine

## 2013-10-07 ENCOUNTER — Emergency Department (HOSPITAL_COMMUNITY)
Admission: EM | Admit: 2013-10-07 | Discharge: 2013-10-07 | Disposition: A | Discharge status: Home / Self Care | Payer: Medicare Other | Attending: Emergency Medicine | Admitting: Emergency Medicine

## 2013-10-07 DIAGNOSIS — Z7982 Long term (current) use of aspirin: Secondary | ICD-10-CM | POA: Insufficient documentation

## 2013-10-07 DIAGNOSIS — R002 Palpitations: Secondary | ICD-10-CM

## 2013-10-07 DIAGNOSIS — Z87891 Personal history of nicotine dependence: Secondary | ICD-10-CM | POA: Insufficient documentation

## 2013-10-07 DIAGNOSIS — N400 Enlarged prostate without lower urinary tract symptoms: Secondary | ICD-10-CM | POA: Insufficient documentation

## 2013-10-07 DIAGNOSIS — Z862 Personal history of diseases of the blood and blood-forming organs and certain disorders involving the immune mechanism: Secondary | ICD-10-CM | POA: Diagnosis not present

## 2013-10-07 DIAGNOSIS — Z79899 Other long term (current) drug therapy: Secondary | ICD-10-CM | POA: Diagnosis not present

## 2013-10-07 DIAGNOSIS — Z8639 Personal history of other endocrine, nutritional and metabolic disease: Secondary | ICD-10-CM | POA: Insufficient documentation

## 2013-10-07 DIAGNOSIS — K219 Gastro-esophageal reflux disease without esophagitis: Secondary | ICD-10-CM | POA: Insufficient documentation

## 2013-10-07 LAB — CBC
HCT: 39.2 % (ref 39.0–52.0)
Hemoglobin: 14.1 g/dL (ref 13.0–17.0)
MCH: 34.3 pg — ABNORMAL HIGH (ref 26.0–34.0)
MCHC: 36 g/dL (ref 30.0–36.0)
MCV: 95.4 fL (ref 78.0–100.0)
Platelets: 132 10*3/uL — ABNORMAL LOW (ref 150–400)
RBC: 4.11 MIL/uL — ABNORMAL LOW (ref 4.22–5.81)
RDW: 12.7 % (ref 11.5–15.5)
WBC: 4.3 10*3/uL (ref 4.0–10.5)

## 2013-10-07 LAB — BASIC METABOLIC PANEL
BUN: 14 mg/dL (ref 6–23)
CO2: 27 mEq/L (ref 19–32)
Calcium: 8.9 mg/dL (ref 8.4–10.5)
Chloride: 101 mEq/L (ref 96–112)
Creatinine, Ser: 1.25 mg/dL (ref 0.50–1.35)
GFR calc Af Amer: 62 mL/min — ABNORMAL LOW (ref >90)
GFR calc non Af Amer: 53 mL/min — ABNORMAL LOW (ref >90)
Glucose, Bld: 101 mg/dL — ABNORMAL HIGH (ref 70–99)
Potassium: 3.6 mEq/L (ref 3.5–5.1)
Sodium: 138 mEq/L (ref 135–145)

## 2013-10-07 LAB — POCT I-STAT TROPONIN I: Troponin i, poc: 0 ng/mL (ref 0.00–0.08)

## 2013-10-07 NOTE — ED Notes (Signed)
Patient discharged to home. NAD.  

## 2013-10-07 NOTE — ED Notes (Signed)
Per pt sts that he checked his BP multiple times this am and noticed that his HR was irregular. sts also that his HR was low in the 40s. Denies any other associated symptoms. sts more SOB for a while.

## 2013-10-07 NOTE — ED Provider Notes (Signed)
CSN: [id number]     Arrival date & time 10/07/13  0847 History   First MD Initiated Contact with Patient 10/07/13 [phone]     Chief Complaint  Patient presents with  . Irregular Heart Beat   (Consider location/radiation/quality/duration/timing/severity/associated sxs/prior Treatment) HPI Comments: Patient reports she was feeling well this morning, was taking his blood pressure with a home machine and he reports his heart rate was in the 40s to 50s and reporting that it was irregular. He checked it multiple times in that state consistent. He reports he had a similar episode one time the other day, but when he rechecked it and it turned out that his heart rate was normal. He has no prior history of cardiac arrhythmia. Again he felt no palpitations, did not feel dizzy, lightheaded. He had no chest pain or shortness of breath. He was concerned because he reports his father and all of his uncles and aunts have pacemakers. He reports no routine change in his usual medications. At this time continues to feel asymptomatic. He reports no nausea vomiting or recent diarrhea.  The history is provided by the patient.    Past Medical History  Diagnosis Date  . BPH (benign prostatic hypertrophy)   . GERD (gastroesophageal reflux disease)   . Hyperlipidemia    Past Surgical History  Procedure Laterality Date  . Cataract extraction, bilateral    . Hand surgery      right  . Tonsillectomy and adenoidectomy     History reviewed. No pertinent family history. History  Substance Use Topics  . Smoking status: Former Smoker -- 1.50 packs/day for 45 years    Types: Cigarettes    Quit date: 08/17/1999  . Smokeless tobacco: Never Used  . Alcohol Use: 3.0 oz/week    5 Glasses of wine per week    Review of Systems  All other systems reviewed and are negative.    Allergies  Review of patient's allergies indicates no known allergies.  Home Medications   Current Outpatient Rx  Name  Route  Sig   Dispense  Refill  . aspirin EC 81 MG tablet   Oral   Take 81 mg by mouth daily.         . beta carotene w/minerals (OCUVITE) tablet   Oral   Take 1 tablet by mouth daily.         . Cholecalciferol (VITAMIN D-3) 5000 UNITS TABS   Oral   Take 1 tablet by mouth every other day.         . colestipol (COLESTID) 1 G tablet   Oral   Take 3 tablets (3 g total) by mouth 2 (two) times daily.   540 tablet   3   . Cyanocobalamin (B-12 PO)   Oral   Take 1 tablet by mouth daily.         . febuxostat (ULORIC) 40 MG tablet   Oral   Take 1 tablet (40 mg total) by mouth daily.   180 tablet   3   . FOLIC ACID PO   Oral   Take 1 tablet by mouth daily.         . hydrochlorothiazide (MICROZIDE) 12.5 MG capsule   Oral   Take 12.5-25 mg by mouth 2 (two) times daily. 12.5 mg in the morning 25 mg at noon         . Multiple Vitamin (MULTIVITAMIN WITH MINERALS) TABS tablet   Oral   Take 1 tablet by mouth daily.         Marland Kitchen  omeprazole (PRILOSEC) 20 MG capsule   Oral   Take 1 capsule (20 mg total) by mouth daily.   90 capsule   3   . Probiotic Product (PROBIOTIC DAILY PO)   Oral   Take 1 tablet by mouth daily. Kefir         . tadalafil (CIALIS) 5 MG tablet   Oral   Take 1 tablet (5 mg total) by mouth daily.   90 tablet   3   . terazosin (HYTRIN) 10 MG capsule   Oral   Take 1 capsule (10 mg total) by mouth daily.   90 capsule   3   . terazosin (HYTRIN) 10 MG capsule   Oral   Take 10 mg by mouth at bedtime.         Marland Kitchen VITAMIN E PO   Oral   Take 1 tablet by mouth daily.         Marland Kitchen VITAMIN K PO   Oral   Take 1 tablet by mouth daily.          BP 120/74  Pulse 66  Temp(Src) 97.7 F (36.5 C) (Oral)  Resp 22  Ht 6' (1.829 m)  Wt 180 lb (81.647 kg)  BMI 24.41 kg/m2  SpO2 98% Physical Exam  Nursing note and vitals reviewed. Constitutional: He is oriented to person, place, and time. He appears well-developed and well-nourished. No distress.  HENT:   Head: Normocephalic and atraumatic.  Eyes: Conjunctivae are normal. No scleral icterus.  Cardiovascular: Normal rate, regular rhythm and intact distal pulses.   Pulmonary/Chest: Effort normal. No respiratory distress. He has no wheezes. He has no rales.  Abdominal: Soft. He exhibits no distension. There is no tenderness.  Musculoskeletal: He exhibits no edema.  Neurological: He is alert and oriented to person, place, and time. He exhibits normal muscle tone.  Skin: Skin is warm and dry. He is not diaphoretic.  Psychiatric: He has a normal mood and affect.    ED Course  Procedures (including critical care time) Labs Review Labs Reviewed  CBC - Abnormal; Notable for the following:    RBC 4.11 (*)    MCH 34.3 (*)    Platelets 132 (*)    All other components within normal limits  BASIC METABOLIC PANEL - Abnormal; Notable for the following:    Glucose, Bld 101 (*)    GFR calc non Af Amer 53 (*)    GFR calc Af Amer 62 (*)    All other components within normal limits  POCT I-STAT TROPONIN I   Imaging Review No results found.  EKG Interpretation     Ventricular Rate:  74 PR Interval:  160 QRS Duration: 88 QT Interval:  392 QTC Calculation: 435 R Axis:   76 Text Interpretation:  Normal sinus rhythm Normal ECG No previous tracing            MDM   1. Palpitations      Patient with no symptoms, EKG is normal by my interpretation. Cardiac monitor shows occasional PVCs which again the patient is asymptomatic. Patient is stable to be discharged home and I recommended he followup closely with his primary care physician to consider a long-term Holter monitor in the future to see if he indeed does have an occult arrhythmia. Otherwise he is told that he can return if he becomes more symptomatic such as chest pain, shortness of breath, dizziness or possible syncopal event.    Gavin Pound. Odie Rauen, MD 10/07/13 [phone]

## 2013-10-07 NOTE — Discharge Instructions (Signed)
Palpitations  A palpitation is the feeling that your heartbeat is irregular or is faster than normal. It may feel like your heart is fluttering or skipping a beat. Palpitations are usually not a serious problem. However, in some cases, you may need further medical evaluation. CAUSES  Palpitations can be caused by:  Smoking.  Caffeine or other stimulants, such as diet pills or energy drinks.  Alcohol.  Stress and anxiety.  Strenuous physical activity.  Fatigue.  Certain medicines.  Heart disease, especially if you have a history of arrhythmias. This includes atrial fibrillation, atrial flutter, or supraventricular tachycardia.  An improperly working pacemaker or defibrillator. DIAGNOSIS  To find the cause of your palpitations, your caregiver will take your history and perform a physical exam. Tests may also be done, including:  Electrocardiography (ECG). This test records the heart's electrical activity.  Cardiac monitoring. This allows your caregiver to monitor your heart rate and rhythm in real time.  Holter monitor. This is a portable device that records your heartbeat and can help diagnose heart arrhythmias. It allows your caregiver to track your heart activity for several days, if needed.  Stress tests by exercise or by giving medicine that makes the heart beat faster. TREATMENT  Treatment of palpitations depends on the cause of your symptoms and can vary greatly. Most cases of palpitations do not require any treatment other than time, relaxation, and monitoring your symptoms. Other causes, such as atrial fibrillation, atrial flutter, or supraventricular tachycardia, usually require further treatment. HOME CARE INSTRUCTIONS   Avoid:  Caffeinated coffee, tea, soft drinks, diet pills, and energy drinks.  Chocolate.  Alcohol.  Stop smoking if you smoke.  Reduce your stress and anxiety. Things that can help you relax include:  A method that measures bodily functions so  you can learn to control them (biofeedback).  Yoga.  Meditation.  Physical activity such as swimming, jogging, or walking.  Get plenty of rest and sleep. SEEK MEDICAL CARE IF:   You continue to have a fast or irregular heartbeat beyond 24 hours.  Your palpitations occur more often. SEEK IMMEDIATE MEDICAL CARE IF:  You develop chest pain or shortness of breath.  You have a severe headache.  You feel dizzy, or you faint. MAKE SURE YOU:  Understand these instructions.  Will watch your condition.  Will get help right away if you are not doing well or get worse. Document Released: 12/10/2000 Document Revised: 06/13/2012 Document Reviewed: 02/11/2012 [employer] Patient Information 2014 [employer].  

## 2013-10-07 NOTE — ED Notes (Addendum)
PATIENT TOOK CIALIS AT 1PM YESTERDAY.

## 2013-10-08 ENCOUNTER — Encounter: Payer: Self-pay | Admitting: Sports Medicine

## 2013-10-08 ENCOUNTER — Ambulatory Visit (INDEPENDENT_AMBULATORY_CARE_PROVIDER_SITE_OTHER): Payer: Medicare Other | Admitting: Sports Medicine

## 2013-10-08 ENCOUNTER — Telehealth: Payer: Self-pay | Admitting: Cardiology

## 2013-10-08 VITALS — BP 125/83 | HR 42 | Wt 185.0 lb

## 2013-10-08 DIAGNOSIS — R0609 Other forms of dyspnea: Secondary | ICD-10-CM | POA: Diagnosis not present

## 2013-10-08 DIAGNOSIS — I4949 Other premature depolarization: Secondary | ICD-10-CM

## 2013-10-08 DIAGNOSIS — I493 Ventricular premature depolarization: Secondary | ICD-10-CM | POA: Insufficient documentation

## 2013-10-08 NOTE — Telephone Encounter (Signed)
Spoke with KIM, aware we can not see pt until 10-24-13

## 2013-10-08 NOTE — Assessment & Plan Note (Signed)
Steven Pearson was recently seen in the emergency department for this, he was having asymptomatic PVCs. These seem to coincide with starting tea regimen and discontinuing alcohol. I have asked him to switch to decaffeinated tea. He does have occasional tightness in his chest that is intermittent, but he thinks may be associated with exertion, and relieved with rest. For this reason I would like him to see our cardiologist for consideration of treadmill stress test. I am not going to start him on a beta blocker today.  He does have some increasing lower extremity swelling, electrolytes were all normal in the emergency department, but a BNP was not yet checked.

## 2013-10-08 NOTE — Progress Notes (Signed)
  Subjective:    CC: Hospital followup  HPI: This is a very pleasant 79 year old male, he is very healthy with essentially only hyperlipidemia, he was completely asymptomatic, used to drink a good amount of alcohol, switched to tea 5 days ago, and had noticed an irregular heartbeat warning on his blood pressure monitoring machine at home. He went to the emergency department, initial troponins, electrolytes, and CBC were essentially normal, an EKG showed premature ventricular contractions. He comes back to me for further followup. He is completely asymptomatic, on further questioning he does get far and in between episodes of substernal tightness with physical exertion that resolves with rest, no nausea, diaphoresis, or radiation to the neck or the arm. No presyncopal symptoms. The symptoms are mild, persistent.  Past medical history, Surgical history, Family history not pertinant except as noted below, Social history, Allergies, and medications have been entered into the medical record, reviewed, and no changes needed.   Review of Systems: No fevers, chills, night sweats, weight loss, chest pain, or shortness of breath.   Objective:    General: Well Developed, well nourished, and in no acute distress.  Neuro: Alert and oriented x3, extra-ocular muscles intact, sensation grossly intact.  HEENT: Normocephalic, atraumatic, pupils equal round reactive to light, neck supple, no masses, no lymphadenopathy, thyroid nonpalpable.  Skin: Warm and dry, no rashes. Cardiac: Regular rate and rhythm, no murmurs rubs or gallops, there is mild 1+ bilateral pitting lower extremity edema.  Respiratory: Clear to auscultation bilaterally. Not using accessory muscles, speaking in full sentences.  12-lead EKG was reviewed, rate is normal at 71, rhythm is essentially normal sinus rhythm with occasional PVCs with compensatory pauses, axis is normal, PR and QTC intervals are normal.  Impression and Recommendations:

## 2013-10-08 NOTE — Telephone Encounter (Signed)
New Problem  Kim from [facility] request to have this pt is seen on 10/15 with Dr. Jens Som in [place].. She asks can you squeeze the pt in.. Please call.

## 2013-10-09 ENCOUNTER — Telehealth: Payer: Self-pay | Admitting: *Deleted

## 2013-10-09 DIAGNOSIS — I4949 Other premature depolarization: Secondary | ICD-10-CM

## 2013-10-09 LAB — BRAIN NATRIURETIC PEPTIDE: Brain Natriuretic Peptide: 106.2 pg/mL — ABNORMAL HIGH (ref 0.0–100.0)

## 2013-10-10 ENCOUNTER — Encounter: Payer: Self-pay | Admitting: Sports Medicine

## 2013-10-10 DIAGNOSIS — R7989 Other specified abnormal findings of blood chemistry: Secondary | ICD-10-CM

## 2013-10-11 ENCOUNTER — Telehealth: Payer: Self-pay | Admitting: Sports Medicine

## 2013-10-11 NOTE — Telephone Encounter (Signed)
Patient called and wants to speak to someone to find out his results from labs done 1st of the week.  Thanks

## 2013-10-11 NOTE — Telephone Encounter (Signed)
I sent him a message giving the lab results.

## 2013-10-15 ENCOUNTER — Encounter: Payer: Self-pay | Admitting: Sports Medicine

## 2013-10-24 ENCOUNTER — Ambulatory Visit (INDEPENDENT_AMBULATORY_CARE_PROVIDER_SITE_OTHER): Payer: BC Managed Care – PPO | Admitting: Cardiology

## 2013-10-24 ENCOUNTER — Encounter: Payer: Self-pay | Admitting: Cardiology

## 2013-10-24 VITALS — BP 124/70 | HR 80 | Wt 190.0 lb

## 2013-10-24 DIAGNOSIS — R0609 Other forms of dyspnea: Secondary | ICD-10-CM

## 2013-10-24 DIAGNOSIS — I4949 Other premature depolarization: Secondary | ICD-10-CM

## 2013-10-24 DIAGNOSIS — R079 Chest pain, unspecified: Secondary | ICD-10-CM | POA: Insufficient documentation

## 2013-10-24 DIAGNOSIS — I493 Ventricular premature depolarization: Secondary | ICD-10-CM

## 2013-10-24 DIAGNOSIS — R0600 Dyspnea, unspecified: Secondary | ICD-10-CM

## 2013-10-24 NOTE — Assessment & Plan Note (Addendum)
Plan echocardiogram to assess LV function. I have explained that in the setting of normal ejection fraction PVCs are benign. He is not having significant palpitations.

## 2013-10-24 NOTE — Progress Notes (Signed)
HPI: 79 year old male for evaluation of chest pain. Patient recently noticed an irregular heartbeat on a blood pressure monitor at home. He was seen at urgent care and an electrocardiogram showed PVCs. Troponin was negative, hemoglobin 14.1, platelets 132 and potassium 3.6. Patient was seen by primary care and was asked to discontinue caffeine use. He also attributes his symptoms to terazosin. Patient has some dyspnea on exertion but no orthopnea, PND, palpitations, recent syncope. Occasional mild pedal edema. He occasionally feels a "twisting" in his chest for seconds. He has not had exertional chest pain.  Current Outpatient Prescriptions  Medication Sig Dispense Refill  . aspirin EC 81 MG tablet Take 81 mg by mouth every other day.       . beta carotene w/minerals (OCUVITE) tablet Take 1 tablet by mouth daily.      . Cholecalciferol (VITAMIN D-3) 5000 UNITS TABS Take 1 tablet by mouth every other day.      . Cyanocobalamin (B-12 PO) Take 1 tablet by mouth daily.      . febuxostat (ULORIC) 40 MG tablet Take 1 tablet (40 mg total) by mouth daily.  180 tablet  3  . FOLIC ACID PO Take 1 tablet by mouth daily.      . hydrochlorothiazide (MICROZIDE) 12.5 MG capsule Take by mouth. 2 tab po am      . Multiple Vitamin (MULTIVITAMIN WITH MINERALS) TABS tablet Take 1 tablet by mouth daily.      Marland Kitchen omeprazole (PRILOSEC) 20 MG capsule Take 1 capsule (20 mg total) by mouth daily.  90 capsule  3  . Probiotic Product (PROBIOTIC DAILY PO) Take 1 tablet by mouth daily. Kefir      . tadalafil (CIALIS) 5 MG tablet Take 1 tablet (5 mg total) by mouth daily.  90 tablet  3  . VITAMIN E PO Take 1 tablet by mouth daily.      Marland Kitchen VITAMIN K PO Take 1 tablet by mouth daily.       No current facility-administered medications for this visit.    No Known Allergies  Past Medical History  Diagnosis Date  . BPH (benign prostatic hypertrophy)   . GERD (gastroesophageal reflux disease)   . Hyperlipidemia   .  Hypertension     Past Surgical History  Procedure Laterality Date  . Cataract extraction, bilateral    . Hand surgery      right  . Tonsillectomy and adenoidectomy      History   Social History  . Marital Status: Widowed    Spouse Name: N/A    Number of Children: 2  . Years of Education: N/A   Occupational History  . Not on file.   Social History Main Topics  . Smoking status: Former Smoker -- 1.50 packs/day for 45 years    Types: Cigarettes    Quit date: 08/17/1999  . Smokeless tobacco: Never Used  . Alcohol Use: 3.0 oz/week    5 Glasses of wine per week     Comment: Occasional  . Drug Use: No  . Sexual Activity: Yes   Other Topics Concern  . Not on file   Social History Narrative  . No narrative on file    Family History  Problem Relation Age of Onset  . Heart disease Father     Pacemaker    ROS: no fevers or chills, productive cough, hemoptysis, dysphasia, odynophagia, melena, hematochezia, dysuria, hematuria, rash, seizure activity, orthopnea, PND, claudication. Remaining systems are negative.  Physical  Exam:   Blood pressure 124/70, pulse 80, weight 190 lb (86.183 kg).  General:  Well developed/well nourished in NAD Skin warm/dry Patient not depressed No peripheral clubbing Back-normal HEENT-normal/normal eyelids Neck supple/normal carotid upstroke bilaterally; no bruits; no JVD; no thyromegaly chest - CTA/ normal expansion CV - RRR/normal S1 and S2; no murmurs, rubs or gallops;  PMI nondisplaced Abdomen -NT/ND, no HSM, no mass, + bowel sounds, no bruit 2+ femoral pulses, no bruits Ext-no edema, chords, 2+ DP Neuro-grossly nonfocal  ECG October 13th 2014-sinus rhythm with occasional PVCs.

## 2013-10-24 NOTE — Assessment & Plan Note (Signed)
Plan exercise treadmill.

## 2013-10-24 NOTE — Patient Instructions (Signed)
Your physician recommends that you schedule a follow-up appointment in: AS NEEDED PENDING TEST RESULTS  Your physician has requested that you have an echocardiogram. Echocardiography is a painless test that uses sound waves to create images of your heart. It provides your doctor with information about the size and shape of your heart and how well your heart's chambers and valves are working. This procedure takes approximately one hour. There are no restrictions for this procedure.   Your physician has requested that you have an exercise tolerance test. For further information please visit [email or web address]. Please also follow instruction sheet, as given.    Exercise Stress Electrocardiography An exercise stress test is a heart test (EKG) which is done while you are moving. You will walk on a treadmill. This test will tell your doctor how your heart does when it is forced to work harder and how much activity you can safely handle. BEFORE THE TEST  Wear shorts or athletic pants.  Wear comfortable tennis shoes.  Women need to wear a bra that allows patches to be put on under it. TEST  An EKG cable will be attached to your waist. This cable is hooked up to patches, which look like round stickers stuck to your chest.  You will be asked to walk on the treadmill.  You will walk until you are too tired or until you are told to stop.  Tell the doctor right away if you have:  Chest pain.  Leg cramps.  Shortness of breath.  Dizziness.  The test may last 30 minutes to 1 hour. The timing depends on your physical condition and the condition of your heart. AFTER THE TEST  You will rest for about 6 minutes. During this time, your heart rhythm and blood pressure will be checked.  The testing equipment will be removed from your body and you can get dressed.  You may go home or back to your hospital room. You may keep doing all your usual activities as told by your doctor. Finding out the  results of your test Ask when your test results will be ready. Make sure you get your test results. Document Released: 05/31/2008 Document Revised: 03/06/2012 Document Reviewed: 05/31/2008 [employer] Patient Information 2014 [employer].   

## 2013-11-09 ENCOUNTER — Ambulatory Visit (HOSPITAL_COMMUNITY): Payer: Medicare Other | Attending: Cardiology | Admitting: Radiology

## 2013-11-09 ENCOUNTER — Encounter: Payer: Self-pay | Admitting: Cardiology

## 2013-11-09 DIAGNOSIS — I059 Rheumatic mitral valve disease, unspecified: Secondary | ICD-10-CM | POA: Diagnosis not present

## 2013-11-09 DIAGNOSIS — R0602 Shortness of breath: Secondary | ICD-10-CM | POA: Insufficient documentation

## 2013-11-09 DIAGNOSIS — Z87891 Personal history of nicotine dependence: Secondary | ICD-10-CM | POA: Insufficient documentation

## 2013-11-09 DIAGNOSIS — I493 Ventricular premature depolarization: Secondary | ICD-10-CM

## 2013-11-09 DIAGNOSIS — R072 Precordial pain: Secondary | ICD-10-CM

## 2013-11-09 DIAGNOSIS — I4949 Other premature depolarization: Secondary | ICD-10-CM | POA: Insufficient documentation

## 2013-11-09 DIAGNOSIS — E785 Hyperlipidemia, unspecified: Secondary | ICD-10-CM | POA: Insufficient documentation

## 2013-11-09 DIAGNOSIS — R0600 Dyspnea, unspecified: Secondary | ICD-10-CM

## 2013-11-09 DIAGNOSIS — R079 Chest pain, unspecified: Secondary | ICD-10-CM | POA: Insufficient documentation

## 2013-11-09 DIAGNOSIS — I079 Rheumatic tricuspid valve disease, unspecified: Secondary | ICD-10-CM | POA: Diagnosis not present

## 2013-11-09 NOTE — Progress Notes (Signed)
Echocardiogram performed.  

## 2013-11-11 ENCOUNTER — Encounter: Payer: Self-pay | Admitting: Sports Medicine

## 2013-11-12 MED ORDER — TERAZOSIN HCL 2 MG PO CAPS: 2.0000 mg | ORAL_CAPSULE | Freq: Every day | ORAL | Status: DC

## 2013-11-28 ENCOUNTER — Ambulatory Visit (INDEPENDENT_AMBULATORY_CARE_PROVIDER_SITE_OTHER): Payer: BC Managed Care – PPO | Admitting: Nurse Practitioner

## 2013-11-28 ENCOUNTER — Encounter: Payer: Self-pay | Admitting: Nurse Practitioner

## 2013-11-28 ENCOUNTER — Other Ambulatory Visit (HOSPITAL_COMMUNITY): Payer: Medicare Other

## 2013-11-28 ENCOUNTER — Encounter: Payer: Self-pay | Admitting: Sports Medicine

## 2013-11-28 VITALS — BP 130/78 | HR 93

## 2013-11-28 DIAGNOSIS — IMO0001 Reserved for inherently not codable concepts without codable children: Secondary | ICD-10-CM

## 2013-11-28 DIAGNOSIS — R079 Chest pain, unspecified: Secondary | ICD-10-CM

## 2013-11-28 DIAGNOSIS — R0600 Dyspnea, unspecified: Secondary | ICD-10-CM

## 2013-11-28 DIAGNOSIS — R0609 Other forms of dyspnea: Secondary | ICD-10-CM

## 2013-11-28 HISTORY — DX: Reserved for inherently not codable concepts without codable children: IMO0001

## 2013-11-28 NOTE — Progress Notes (Signed)
Exercise Treadmill Test  Pre-Exercise Testing Evaluation Rhythm: normal sinus  Rate: 93 bpm     Test  Exercise Tolerance Test Ordering MD: Olga Millers, MD  Interpreting MD: Norma Fredrickson, NP  Unique Test No: 1  Treadmill:  1  Indication for ETT: chest pain - rule out ischemia  Contraindication to ETT: No   Stress Modality: exercise - treadmill  Cardiac Imaging Performed: non   Protocol: standard Bruce - maximal  Max BP:  160/72  Max MPHR (bpm):  141 85% MPR (bpm):  120  MPHR obtained (bpm):  148 % MPHR obtained:  104%  Reached 85% MPHR (min:sec):  1:50 Total Exercise Time (min-sec):  6  Workload in METS:  7.0 Borg Scale: 15  Reason ETT Terminated:  patient's desire to stop    ST Segment Analysis At Rest: normal ST segments - no evidence of significant ST depression With Exercise: no evidence of significant ST depression  Other Information Arrhythmia:  No Angina during ETT:  absent (0) Quality of ETT:  diagnostic  ETT Interpretation:  normal - no evidence of ischemia by ST analysis  Comments: Patient presents today for routine GXT. Has had atypical chest pain, dyspnea and palpitations with PVCs noted.   Today the patient exercised on the standard Bruce protocol for a total of 6 minutes.  Reduced exercise tolerance.  Adequate blood pressure response.  Clinically negative for chest pain. Test was stopped due to fatigue, achievement of target HR.   EKG negative for ischemia. No significant arrhythmia noted. Rare PVCs noted with exercise.  Recommendations: CV risk factor modification.  See back prn.  Patient is agreeable to this plan and will call if any problems develop in the interim.   Steven Macadamia, RN, ANP-C [facility] [address] [place], [place]  [address]

## 2013-11-29 ENCOUNTER — Ambulatory Visit (INDEPENDENT_AMBULATORY_CARE_PROVIDER_SITE_OTHER): Payer: BC Managed Care – PPO | Admitting: Sports Medicine

## 2013-11-29 ENCOUNTER — Encounter: Payer: Self-pay | Admitting: Sports Medicine

## 2013-11-29 VITALS — BP 123/84 | HR 58 | Wt 187.0 lb

## 2013-11-29 DIAGNOSIS — I4949 Other premature depolarization: Secondary | ICD-10-CM

## 2013-11-29 DIAGNOSIS — I493 Ventricular premature depolarization: Secondary | ICD-10-CM

## 2013-11-29 DIAGNOSIS — Z299 Encounter for prophylactic measures, unspecified: Secondary | ICD-10-CM

## 2013-11-29 DIAGNOSIS — M109 Gout, unspecified: Secondary | ICD-10-CM

## 2013-11-29 DIAGNOSIS — E785 Hyperlipidemia, unspecified: Secondary | ICD-10-CM

## 2013-11-29 DIAGNOSIS — N400 Enlarged prostate without lower urinary tract symptoms: Secondary | ICD-10-CM

## 2013-11-29 DIAGNOSIS — K219 Gastro-esophageal reflux disease without esophagitis: Secondary | ICD-10-CM

## 2013-11-29 NOTE — Assessment & Plan Note (Signed)
Overall happy with current urinary situation. We will recheck a urinalysis at the next visit as he did have a history of proteinuria in the past. We can also recheck his PSA at the next visit.

## 2013-11-29 NOTE — Progress Notes (Signed)
  Subjective:    CC: Follow up  HPI: This is a very pleasant 79 year old male. Palpitations: Resolved since decreasing Hytrin, and caffeine. He did have a negative stress test, and negative echocardiogram. We discussed CT coronary artery calcium scoring and how this is not necessary.  BPH: Obstructive uropathy symptoms are currently acceptable.  Hyperlipidemia: Stable.  Gout: Stable on current medications.  GERD: Stable on omeprazole.  Past medical history, Surgical history, Family history not pertinant except as noted below, Social history, Allergies, and medications have been entered into the medical record, reviewed, and no changes needed.   Review of Systems: No fevers, chills, night sweats, weight loss, chest pain, or shortness of breath.   Objective:    General: Well Developed, well nourished, and in no acute distress.  Neuro: Alert and oriented x3, extra-ocular muscles intact, sensation grossly intact.  HEENT: Normocephalic, atraumatic, pupils equal round reactive to light, neck supple, no masses, no lymphadenopathy, thyroid nonpalpable.  Skin: Warm and dry, no rashes. Cardiac: Regular rate and rhythm, no murmurs rubs or gallops, no lower extremity edema.  Respiratory: Clear to auscultation bilaterally. Not using accessory muscles, speaking in full sentences.  Impression and Recommendations:

## 2013-11-29 NOTE — Assessment & Plan Note (Addendum)
Essentially resolved and significantly improved with decreasing dose of Terazosin He has had a negative stress test and normal echocardiogram.

## 2013-11-29 NOTE — Assessment & Plan Note (Signed)
Well controlled, no changes 

## 2013-11-29 NOTE — Assessment & Plan Note (Signed)
Continue omeprazole 

## 2013-11-29 NOTE — Patient Instructions (Signed)
Exercise prescription:  You should adjust the intensity of your exercise based on your heart rate. The [employer] sports medicine recommends keeping your heart rate between 70-80% of its maximum for 30 minutes, 3-5 times per week. Maximum heart rate = (220 - age). Multiply this number by 0.75 to get your goal heart rate. If lower, then increase the intensity of your exercise. If the number is higher, you may decrease the intensity of your exercise. 

## 2013-11-29 NOTE — Assessment & Plan Note (Signed)
Continue Uloric, no changes.

## 2013-11-29 NOTE — Assessment & Plan Note (Signed)
Doing extremely well, return 6 months. Exercise prescription given.

## 2013-12-03 ENCOUNTER — Encounter: Payer: Self-pay | Admitting: Sports Medicine

## 2013-12-10 ENCOUNTER — Ambulatory Visit (INDEPENDENT_AMBULATORY_CARE_PROVIDER_SITE_OTHER): Payer: BC Managed Care – PPO | Admitting: Sports Medicine

## 2013-12-10 ENCOUNTER — Encounter: Payer: Self-pay | Admitting: Sports Medicine

## 2013-12-10 VITALS — BP 111/58 | HR 46

## 2013-12-10 DIAGNOSIS — I493 Ventricular premature depolarization: Secondary | ICD-10-CM

## 2013-12-10 DIAGNOSIS — I4949 Other premature depolarization: Secondary | ICD-10-CM

## 2013-12-10 NOTE — Progress Notes (Signed)
  Subjective:    CC: Followup  HPI: Steven Pearson continues to have significant worry and anxiety surrounding his premature ventricular contractions. He did have some skipped beats, EKG subsequently showed PVCs. He has been evaluated by cardiology, he has had a negative exercise stress test, as well as a negative echocardiogram. Unfortunately he does have occasional heart rates into the 40s, he tells me now that he feels somewhat woozy, sluggish, hazy, but still denies presyncopal symptoms. He denies any chest pains. Because of his low resting heart rate we have avoided beta blockers. He continues to worry, and wonders if he needs a pacer.  Past medical history, Surgical history, Family history not pertinant except as noted below, Social history, Allergies, and medications have been entered into the medical record, reviewed, and no changes needed.   Review of Systems: No fevers, chills, night sweats, weight loss, chest pain, or shortness of breath.   Objective:    General: Well Developed, well nourished, and in no acute distress.  Neuro: Alert and oriented x3, extra-ocular muscles intact, sensation grossly intact.  HEENT: Normocephalic, atraumatic, pupils equal round reactive to light, neck supple, no masses, no lymphadenopathy, thyroid nonpalpable.  Skin: Warm and dry, no rashes. Cardiac: Regular rate, irregular rhythm, no murmurs rubs or gallops, no lower extremity edema.  Respiratory: Clear to auscultation bilaterally. Not using accessory muscles, speaking in full sentences.  Impression and Recommendations:

## 2013-12-10 NOTE — Assessment & Plan Note (Signed)
[place] has a very well-controlled blood pressure, he still has occasional PVCs, as well as heart rates in the 40s and occasional 30s. He does feel foggy, hazy, and a little bit woozy during these episodes. He also feels very sluggish. This does seem to apply that his bradycardia is symptomatic and he may be a candidate for a pacer. He did have a negative echo, and a negative stress test. I would like him to touch base again with cardiology for assistance in evaluation of the next step.

## 2013-12-12 ENCOUNTER — Ambulatory Visit (INDEPENDENT_AMBULATORY_CARE_PROVIDER_SITE_OTHER): Payer: Medicare Other | Admitting: Cardiology

## 2013-12-12 ENCOUNTER — Encounter: Payer: Self-pay | Admitting: Cardiology

## 2013-12-12 ENCOUNTER — Other Ambulatory Visit: Payer: Self-pay | Admitting: Cardiology

## 2013-12-12 VITALS — BP 110/68 | HR 58 | Wt 190.0 lb

## 2013-12-12 DIAGNOSIS — I4949 Other premature depolarization: Secondary | ICD-10-CM | POA: Diagnosis not present

## 2013-12-12 DIAGNOSIS — E785 Hyperlipidemia, unspecified: Secondary | ICD-10-CM | POA: Diagnosis not present

## 2013-12-12 DIAGNOSIS — R079 Chest pain, unspecified: Secondary | ICD-10-CM | POA: Diagnosis not present

## 2013-12-12 DIAGNOSIS — I493 Ventricular premature depolarization: Secondary | ICD-10-CM

## 2013-12-12 NOTE — Assessment & Plan Note (Signed)
Patient is having significant number of PVCs on examination. LV function is normal. When checking his pulse at home he has documented heart rates in the 30s and 40s based on his blood pressure cuff. I think the automated cuff may be under counting premature beats. I will schedule a 48-hour Holter monitor to further assess for bradycardia and number of PVCs. He is describing fatigue. I wonder if he is under perfusing because of frequent PVCs. If so we will consider a beta blocker. Check TSH, hemoglobin, magnesium and potassium.

## 2013-12-12 NOTE — Assessment & Plan Note (Signed)
Symptoms atypical. He actually notices improvement with exercise. Exercise treadmill negative. No further ischemia evaluation.

## 2013-12-12 NOTE — Patient Instructions (Signed)
Your physician recommends that you schedule a follow-up appointment in: 4 WEEKS WITH DR Jens Som  Your physician has recommended that you wear a 48 HOUR holter monitor. Holter monitors are medical devices that record the heart's electrical activity. Doctors most often use these monitors to diagnose arrhythmias. Arrhythmias are problems with the speed or rhythm of the heartbeat. The monitor is a small, portable device. You can wear one while you do your normal daily activities. This is usually used to diagnose what is causing palpitations/syncope (passing out).    Your physician recommends that you HAVE LAB WORK TODAY

## 2013-12-12 NOTE — Progress Notes (Signed)
HPI: FU chest pain. Patient recently noticed an irregular heartbeat on a blood pressure monitor at home. He was seen at urgent care and an electrocardiogram showed PVCs. Troponin was negative, hemoglobin 14.1, platelets 132 and potassium 3.6. Patient also had atypical chest pain. Exercise treadmill in December of 2014: Patient exercised for 6 minutes with no chest pain and no ST changes. Occasional PVCs noted. Echocardiogram in November 2014 showed normal LV function, grade 1 diastolic dysfunction, mild left atrial enlargement. Since he was last seen, he notes dyspnea on exertion but no orthopnea or PND. Chronic mild pedal edema. He occasionally feels a twisting sensation in his chest for one to 2 seconds but no exertional chest pain. He does note fatigue. He states his pulse is slow and irregular at times on his automated blood pressure cuff.   Current Outpatient Prescriptions  Medication Sig Dispense Refill  . aspirin EC 81 MG tablet Take 81 mg by mouth every other day.       . beta carotene w/minerals (OCUVITE) tablet Take 1 tablet by mouth daily.      . Cholecalciferol (VITAMIN D-3) 5000 UNITS TABS Take 1 tablet by mouth every other day.      . Cyanocobalamin (B-12 PO) Take 1 tablet by mouth daily.      . febuxostat (ULORIC) 40 MG tablet Take 1 tablet (40 mg total) by mouth daily.  180 tablet  3  . FOLIC ACID PO Take 1 tablet by mouth daily.      . Multiple Vitamin (MULTIVITAMIN WITH MINERALS) TABS tablet Take 1 tablet by mouth daily.      . Probiotic Product (PROBIOTIC DAILY PO) Take 1 tablet by mouth daily. Kefir      . terazosin (HYTRIN) 2 MG capsule Take 1 capsule (2 mg total) by mouth at bedtime.  90 capsule  3  . VITAMIN E PO Take 1 tablet by mouth daily.      Marland Kitchen VITAMIN K PO Take 1 tablet by mouth daily.       No current facility-administered medications for this visit.     Past Medical History  Diagnosis Date  . BPH (benign prostatic hypertrophy)   . GERD  (gastroesophageal reflux disease)   . Hyperlipidemia   . Hypertension   . PVC (premature ventricular contraction)   . Normal cardiac stress test 11/28/13    Treadmill    Past Surgical History  Procedure Laterality Date  . Cataract extraction, bilateral    . Hand surgery      right  . Tonsillectomy and adenoidectomy      History   Social History  . Marital Status: Widowed    Spouse Name: N/A    Number of Children: 2  . Years of Education: N/A   Occupational History  . Not on file.   Social History Main Topics  . Smoking status: Former Smoker -- 1.50 packs/day for 45 years    Types: Cigarettes    Quit date: 08/17/1999  . Smokeless tobacco: Never Used  . Alcohol Use: 3.0 oz/week    5 Glasses of wine per week     Comment: Occasional  . Drug Use: No  . Sexual Activity: Not Currently   Other Topics Concern  . Not on file   Social History Narrative  . No narrative on file    ROS: fatigue but no fevers or chills, productive cough, hemoptysis, dysphasia, odynophagia, melena, hematochezia, dysuria, hematuria, rash, seizure activity, orthopnea, PND,  claudication.  Remaining systems are negative.  Physical Exam: Well-developed well-nourished in no acute distress.  Skin is warm and dry.  HEENT is normal.  Neck is supple.  Chest is clear to auscultation with normal expansion.  Cardiovascular exam is regular rate and rhythm.  Abdominal exam nontender or distended. No masses palpated. Extremities show 1+ ankle edema. neuro grossly intact

## 2013-12-13 LAB — CBC
HCT: 41.2 % (ref 39.0–52.0)
Hemoglobin: 13.9 g/dL (ref 13.0–17.0)
MCH: 32.6 pg (ref 26.0–34.0)
MCHC: 33.7 g/dL (ref 30.0–36.0)
MCV: 96.7 fL (ref 78.0–100.0)
Platelets: 178 10*3/uL (ref 150–400)
RBC: 4.26 MIL/uL (ref 4.22–5.81)
RDW: 12.7 % (ref 11.5–15.5)
WBC: 5.4 10*3/uL (ref 4.0–10.5)

## 2013-12-13 LAB — BASIC METABOLIC PANEL WITH GFR
BUN: 20 mg/dL (ref 6–23)
CO2: 27 mEq/L (ref 19–32)
Calcium: 9 mg/dL (ref 8.4–10.5)
Chloride: 104 mEq/L (ref 96–112)
Creat: 1.16 mg/dL (ref 0.50–1.35)
GFR, Est African American: 69 mL/min
GFR, Est Non African American: 60 mL/min
Glucose, Bld: 89 mg/dL (ref 70–99)
Potassium: 4.5 mEq/L (ref 3.5–5.3)
Sodium: 140 mEq/L (ref 135–145)

## 2013-12-13 LAB — TSH: TSH: 1.948 u[IU]/mL (ref 0.350–4.500)

## 2013-12-13 LAB — MAGNESIUM: Magnesium: 1.8 mg/dL (ref 1.5–2.5)

## 2013-12-24 DIAGNOSIS — L820 Inflamed seborrheic keratosis: Secondary | ICD-10-CM | POA: Diagnosis not present

## 2013-12-24 DIAGNOSIS — Z85828 Personal history of other malignant neoplasm of skin: Secondary | ICD-10-CM | POA: Diagnosis not present

## 2014-01-01 ENCOUNTER — Encounter (INDEPENDENT_AMBULATORY_CARE_PROVIDER_SITE_OTHER): Payer: BC Managed Care – PPO

## 2014-01-01 ENCOUNTER — Encounter: Payer: Self-pay | Admitting: *Deleted

## 2014-01-01 DIAGNOSIS — I493 Ventricular premature depolarization: Secondary | ICD-10-CM

## 2014-01-01 DIAGNOSIS — R079 Chest pain, unspecified: Secondary | ICD-10-CM

## 2014-01-01 DIAGNOSIS — I4949 Other premature depolarization: Secondary | ICD-10-CM | POA: Diagnosis not present

## 2014-01-01 NOTE — Progress Notes (Signed)
Patient ID: Steven Pearson, male   DOB: Jan 11, 1932, 80 y.o.   MRN: [id number] [facility] 48 hour holter monitor applied to patient.

## 2014-01-09 ENCOUNTER — Ambulatory Visit: Payer: Medicare Other | Admitting: Cardiology

## 2014-01-09 ENCOUNTER — Ambulatory Visit (INDEPENDENT_AMBULATORY_CARE_PROVIDER_SITE_OTHER): Payer: Medicare Other | Admitting: Cardiology

## 2014-01-09 ENCOUNTER — Encounter: Payer: Self-pay | Admitting: Cardiology

## 2014-01-09 VITALS — BP 130/70 | HR 64 | Ht 61.0 in | Wt 185.0 lb

## 2014-01-09 DIAGNOSIS — I493 Ventricular premature depolarization: Secondary | ICD-10-CM

## 2014-01-09 DIAGNOSIS — I4949 Other premature depolarization: Secondary | ICD-10-CM

## 2014-01-09 MED ORDER — METOPROLOL SUCCINATE ER 25 MG PO TB24: 25.0000 mg | ORAL_TABLET | Freq: Every day | ORAL | Status: DC

## 2014-01-09 NOTE — Patient Instructions (Signed)
Your physician recommends that you schedule a follow-up appointment in: 8 WEEKS WITH DR CRENSHAW  STOP LISINOPRIL  START METOPROLOL SUCC ER 25 MG ONCE DAILY AT BEDTIME

## 2014-01-09 NOTE — Assessment & Plan Note (Signed)
No further symptoms. Exercise treadmill negative. No further evaluation at this point.

## 2014-01-09 NOTE — Progress Notes (Signed)
HPI: FU chest pain. Patient recently noticed an irregular heartbeat on a blood pressure monitor at home. He was seen at urgent care and an electrocardiogram showed PVCs. Troponin was negative, hemoglobin 14.1, platelets 132 and potassium 3.6. Patient also had atypical chest pain. Exercise treadmill in December of 2014: Patient exercised for 6 minutes with no chest pain and no ST changes. Occasional PVCs noted. Echocardiogram in November 2014 showed normal LV function, grade 1 diastolic dysfunction, mild left atrial enlargement. I last saw him in December of 2014. He was complaining of fatigue and bradycardia. I felt he may be having PVCs and his monitor was undercounting his heart rate. Laboratories in December of 2014 showed a hemoglobin of 13.9, normal TSH, normal potassium and normal magnesium. Holter monitor in January of 2014 showed sinus rhythm with frequent PVCs, occasional couplet, occasional PAC and brief PAT. Mean heart rate 82. Since I last saw him, he continues to have some fatigue. Mild dyspnea on exertion at times but at other times he can exercise without complaints. No syncope or chest pain.   Current Outpatient Prescriptions  Medication Sig Dispense Refill  . ALPHA LIPOIC ACID PO Take 1 tablet by mouth daily.      Marland Kitchen aspirin EC 81 MG tablet Take 81 mg by mouth every other day.       . beta carotene w/minerals (OCUVITE) tablet Take 1 tablet by mouth daily.      . Cholecalciferol (VITAMIN D-3) 5000 UNITS TABS Take 1 tablet by mouth every other day.      . Cyanocobalamin (B-12 PO) Take 1 tablet by mouth daily.      . febuxostat (ULORIC) 40 MG tablet Take 1 tablet (40 mg total) by mouth daily.  180 tablet  3  . FOLIC ACID PO Take 1 tablet by mouth daily.      Marland Kitchen lisinopril (PRINIVIL,ZESTRIL) 2.5 MG tablet Take 1 tablet by mouth daily.      . Multiple Vitamin (MULTIVITAMIN WITH MINERALS) TABS tablet Take 1 tablet by mouth daily.      . NON FORMULARY Kefir 3 oz daily      .  Probiotic Product (PROBIOTIC DAILY PO) Take 1 tablet by mouth daily. Kefir      . terazosin (HYTRIN) 2 MG capsule Take 1 capsule (2 mg total) by mouth at bedtime.  90 capsule  3  . Ubiquinol 100 MG CAPS Take 1 tablet by mouth daily.      Marland Kitchen VITAMIN E PO Take 1 tablet by mouth daily.      Marland Kitchen VITAMIN K PO Take 1 tablet by mouth daily.       No current facility-administered medications for this visit.     Past Medical History  Diagnosis Date  . BPH (benign prostatic hypertrophy)   . GERD (gastroesophageal reflux disease)   . Hyperlipidemia   . Hypertension   . PVC (premature ventricular contraction)   . Normal cardiac stress test 11/28/13    Treadmill    Past Surgical History  Procedure Laterality Date  . Cataract extraction, bilateral    . Hand surgery      right  . Tonsillectomy and adenoidectomy      History   Social History  . Marital Status: Widowed    Spouse Name: N/A    Number of Children: 2  . Years of Education: N/A   Occupational History  . Not on file.   Social History Main Topics  . Smoking status:  Former Smoker -- 1.50 packs/day for 45 years    Types: Cigarettes    Quit date: 08/17/1999  . Smokeless tobacco: Never Used  . Alcohol Use: 3.0 oz/week    5 Glasses of wine per week     Comment: Occasional  . Drug Use: No  . Sexual Activity: Not Currently   Other Topics Concern  . Not on file   Social History Narrative  . No narrative on file    ROS: no fevers or chills, productive cough, hemoptysis, dysphasia, odynophagia, melena, hematochezia, dysuria, hematuria, rash, seizure activity, orthopnea, PND, pedal edema, claudication. Remaining systems are negative.  Physical Exam: Well-developed well-nourished in no acute distress.  Skin is warm and dry.  HEENT is normal.  Neck is supple.  Chest is clear to auscultation with normal expansion.  Cardiovascular exam is regular rate and rhythm.  Abdominal exam nontender or distended. No masses  palpated. Extremities show no edema. neuro grossly intact

## 2014-01-09 NOTE — Assessment & Plan Note (Signed)
Holter monitor shows frequent PVCs, occasional couplets and occasional PACs. Also with brief PAT. LV function is normal. When checking his pulse at home he has documented heart rates in the 30s and 40s based on his blood pressure cuff. I think the automated cuff is under counting premature beats. He is describing fatigue. I wonder if he is under perfusing because of frequent PVCs. Discontinue lisinopril. Add Toprol 25 mg daily. Hopefully suppressing his ectopy will improve his symptoms. Note TSH, hemoglobin, magnesium and potassium are normal.

## 2014-03-06 ENCOUNTER — Encounter: Payer: Self-pay | Admitting: Cardiology

## 2014-03-06 ENCOUNTER — Ambulatory Visit (INDEPENDENT_AMBULATORY_CARE_PROVIDER_SITE_OTHER): Payer: Medicare Other | Admitting: Cardiology

## 2014-03-06 VITALS — BP 124/62 | HR 66 | Ht 73.0 in | Wt 188.0 lb

## 2014-03-06 DIAGNOSIS — I493 Ventricular premature depolarization: Secondary | ICD-10-CM

## 2014-03-06 DIAGNOSIS — R079 Chest pain, unspecified: Secondary | ICD-10-CM | POA: Diagnosis not present

## 2014-03-06 DIAGNOSIS — I4949 Other premature depolarization: Secondary | ICD-10-CM | POA: Diagnosis not present

## 2014-03-06 NOTE — Patient Instructions (Signed)
Your physician wants you to follow-up in: 6 MONTHS WITH DR CRENSHAW You will receive a reminder letter in the mail two months in advance. If you don't receive a letter, please call our office to schedule the follow-up appointment.  

## 2014-03-06 NOTE — Assessment & Plan Note (Signed)
No further symptoms. Exercise treadmill negative. 

## 2014-03-06 NOTE — Progress Notes (Signed)
HPI: FU chest pain. Patient recently noticed an irregular heartbeat on a blood pressure monitor at home. He was seen at urgent care and an electrocardiogram showed PVCs. Troponin was negative, hemoglobin 14.1, platelets 132 and potassium 3.6. Patient also had atypical chest pain. Exercise treadmill in December of 2014: Patient exercised for 6 minutes with no chest pain and no ST changes. Occasional PVCs noted. Echocardiogram in November 2014 showed normal LV function, grade 1 diastolic dysfunction, mild left atrial enlargement. I saw him in December of 2014. He was complaining of fatigue and bradycardia. I felt he may be having PVCs and his monitor was undercounting his heart rate. Laboratories in December of 2014 showed a hemoglobin of 13.9, normal TSH, normal potassium and normal magnesium. Holter monitor in January of 2014 showed sinus rhythm with frequent PVCs, occasional couplet, occasional PAC and brief PAT. Mean heart rate 82. I added a beta blocker. However his symptoms improved and he did not begin this medication. Since I last saw him the patient has dyspnea with more extreme activities but not with routine activities. It is relieved with rest. It is not associated with chest pain. There is no orthopnea, PND. There is no syncope or palpitations. There is no exertional chest pain. Chronic mild pedal edema.    Current Outpatient Prescriptions  Medication Sig Dispense Refill  . ALPHA LIPOIC ACID PO Take 1 tablet by mouth daily.      Marland Kitchen aspirin EC 81 MG tablet Take 81 mg by mouth every other day.       . beta carotene w/minerals (OCUVITE) tablet Take 1 tablet by mouth daily.      . Cholecalciferol (VITAMIN D-3) 5000 UNITS TABS Take 1 tablet by mouth every other day.      . Cyanocobalamin (B-12 PO) Take 1 tablet by mouth daily.      . febuxostat (ULORIC) 40 MG tablet Take 1 tablet (40 mg total) by mouth daily.  180 tablet  3  . FOLIC ACID PO Take 1 tablet by mouth daily.      . Multiple  Vitamin (MULTIVITAMIN WITH MINERALS) TABS tablet Take 1 tablet by mouth daily.      . NON FORMULARY Kefir 3 oz daily      . Probiotic Product (PROBIOTIC DAILY PO) Take 1 tablet by mouth daily. Kefir      . terazosin (HYTRIN) 2 MG capsule Take 1 capsule (2 mg total) by mouth at bedtime.  90 capsule  3  . Ubiquinol 100 MG CAPS Take 1 tablet by mouth daily.      Marland Kitchen VITAMIN E PO Take 1 tablet by mouth daily.      Marland Kitchen VITAMIN K PO Take 1 tablet by mouth daily.      . hydrochlorothiazide (MICROZIDE) 12.5 MG capsule One tablet daily      . MICRONIZED COLESTIPOL HCL 1 G tablet 6mg  in am and 6 mg in pm       No current facility-administered medications for this visit.     Past Medical History  Diagnosis Date  . BPH (benign prostatic hypertrophy)   . GERD (gastroesophageal reflux disease)   . Hyperlipidemia   . Hypertension   . PVC (premature ventricular contraction)   . Normal cardiac stress test 11/28/13    Treadmill    Past Surgical History  Procedure Laterality Date  . Cataract extraction, bilateral    . Hand surgery      right  . Tonsillectomy and adenoidectomy  History   Social History  . Marital Status: Widowed    Spouse Name: N/A    Number of Children: 2  . Years of Education: N/A   Occupational History  . Not on file.   Social History Main Topics  . Smoking status: Former Smoker -- 1.50 packs/day for 45 years    Types: Cigarettes    Quit date: 08/17/1999  . Smokeless tobacco: Never Used  . Alcohol Use: 3.0 oz/week    5 Glasses of wine per week     Comment: Occasional  . Drug Use: No  . Sexual Activity: Not Currently   Other Topics Concern  . Not on file   Social History Narrative  . No narrative on file    ROS: no fevers or chills, productive cough, hemoptysis, dysphasia, odynophagia, melena, hematochezia, dysuria, hematuria, rash, seizure activity, orthopnea, PND, claudication. Remaining systems are negative.  Physical Exam: Well-developed  well-nourished in no acute distress.  Skin is warm and dry.  HEENT is normal.  Neck is supple.  Chest is clear to auscultation with normal expansion.  Cardiovascular exam is regular rate and rhythm.  Abdominal exam nontender or distended. No masses palpated. Extremities show 1+ ankle edema. neuro grossly intact

## 2014-03-06 NOTE — Assessment & Plan Note (Signed)
Holter monitor showed frequent PVCs, occasional couplets and occasional PACs. Also with brief PAT. LV function is normal. When checking his pulse at home he had documented heart rates in the 30s and 40s based on his blood pressure cuff. I think the automated cuff was under counting premature beats. He was describing fatigue and I thought he may be having symptoms because of under perfusing with frequent PVCs. His symptoms have resolved with exercise. He did not initiate Toprol. I have asked him to take this as needed. Hopefully suppressing his ectopy will improve his symptoms. Note previous TSH, hemoglobin, magnesium and potassium were normal.

## 2014-03-15 ENCOUNTER — Other Ambulatory Visit: Payer: Self-pay | Admitting: Sports Medicine

## 2014-03-15 MED ORDER — FEBUXOSTAT 80 MG PO TABS: 1.0000 | ORAL_TABLET | Freq: Every day | ORAL | Status: DC

## 2014-03-15 MED ORDER — HYDROCHLOROTHIAZIDE 12.5 MG PO CAPS: 12.5000 mg | ORAL_CAPSULE | Freq: Two times a day (BID) | ORAL | Status: DC

## 2014-03-20 ENCOUNTER — Encounter: Payer: Self-pay | Admitting: Sports Medicine

## 2014-03-21 MED ORDER — FEBUXOSTAT 40 MG PO TABS: 40.0000 mg | ORAL_TABLET | Freq: Every day | ORAL | Status: DC

## 2014-03-21 MED ORDER — HYDROCHLOROTHIAZIDE 12.5 MG PO TABS: 12.5000 mg | ORAL_TABLET | Freq: Two times a day (BID) | ORAL | Status: DC

## 2014-04-26 ENCOUNTER — Encounter: Payer: Self-pay | Admitting: Sports Medicine

## 2014-04-26 MED ORDER — OMEPRAZOLE 10 MG PO CPDR: 10.0000 mg | DELAYED_RELEASE_CAPSULE | Freq: Every day | ORAL | Status: DC

## 2014-04-26 NOTE — Telephone Encounter (Signed)
Usually no adverse or long term effects, issues are RARE.  Calling in low dose.

## 2014-05-14 ENCOUNTER — Encounter: Payer: Self-pay | Admitting: Sports Medicine

## 2014-05-14 ENCOUNTER — Ambulatory Visit (INDEPENDENT_AMBULATORY_CARE_PROVIDER_SITE_OTHER): Payer: Medicare Other | Admitting: Sports Medicine

## 2014-05-14 VITALS — BP 124/79 | HR 98 | Ht 73.0 in | Wt 191.0 lb

## 2014-05-14 DIAGNOSIS — R600 Localized edema: Secondary | ICD-10-CM | POA: Insufficient documentation

## 2014-05-14 DIAGNOSIS — H00019 Hordeolum externum unspecified eye, unspecified eyelid: Secondary | ICD-10-CM | POA: Diagnosis not present

## 2014-05-14 DIAGNOSIS — R609 Edema, unspecified: Secondary | ICD-10-CM

## 2014-05-14 DIAGNOSIS — Z299 Encounter for prophylactic measures, unspecified: Secondary | ICD-10-CM

## 2014-05-14 MED ORDER — AMBULATORY NON FORMULARY MEDICATION: Status: DC

## 2014-05-14 MED ORDER — FUROSEMIDE 40 MG PO TABS: ORAL_TABLET | ORAL | Status: DC

## 2014-05-14 MED ORDER — DOXYCYCLINE HYCLATE 100 MG PO TABS: 100.0000 mg | ORAL_TABLET | Freq: Two times a day (BID) | ORAL | Status: AC

## 2014-05-14 NOTE — Progress Notes (Signed)
  Subjective:    CC: Several concerns  HPI: Bump on eyelid: Present for a week now, really not painful.  Pneumococcal vaccination: Wonders if he needs another shot. He did have a pneumococcal 23 vaccine in 2013.  Lower extremity swelling: Chronic, typically resolves with hydrochlorothiazide but unfortunately has persisted. No orthopnea, paroxysmal nocturnal dyspnea.  Past medical history, Surgical history, Family history not pertinant except as noted below, Social history, Allergies, and medications have been entered into the medical record, reviewed, and no changes needed.   Review of Systems: No fevers, chills, night sweats, weight loss, chest pain, or shortness of breath.   Objective:    General: Well Developed, well nourished, and in no acute distress.  Neuro: Alert and oriented x3, extra-ocular muscles intact, sensation grossly intact.  HEENT: Normocephalic, atraumatic, pupils equal round reactive to light, neck supple, no masses, no lymphadenopathy, thyroid nonpalpable. Noted a stye on the right eyelid. Skin: Warm and dry, no rashes. Cardiac: Regular rate and rhythm, no murmurs rubs or gallops, 2+ lower extremity edema to the knees. Negative Homans sign bilaterally. Respiratory: Clear to auscultation bilaterally. Not using accessory muscles, speaking in full sentences.  Impression and Recommendations:

## 2014-05-14 NOTE — Assessment & Plan Note (Signed)
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 Adding Lasix for occasional use, he will also get lower extremity compression stockings.

## 2014-05-14 NOTE — Patient Instructions (Signed)
Chalazion  A chalazion is a swelling or hard lump on the eyelid caused by a blocked oil gland. Chalazions may occur on the upper or the lower eyelid.   CAUSES   Oil gland in the eyelid becomes blocked.  SYMPTOMS   · Swelling or hard lump on the eyelid. This lump may make it hard to see out of the eye.  · The swelling may spread to areas around the eye.  TREATMENT   · Although some chalazions disappear by themselves in 1 or 2 months, some chalazions may need to be removed.  · Medicines to treat an infection may be required.  HOME CARE INSTRUCTIONS   · Wash your hands often and dry them with a clean towel. Do not touch the chalazion.  · Apply heat to the eyelid several times a day for 10 minutes to help ease discomfort and bring any yellowish white fluid (pus) to the surface. One way to apply heat to a chalazion is to use the handle of a metal spoon.  · Hold the handle under hot water until it is hot, and then wrap the handle in paper towels so that the heat can come through without burning your skin.  · Hold the wrapped handle against the chalazion and reheat the spoon handle as needed.  · Apply heat in this fashion for 10 minutes, 4 times per day.  · Return to your caregiver to have the pus removed if it does not break (rupture) on its own.  · Do not try to remove the pus yourself by squeezing the chalazion or sticking it with a pin or needle.  · Only take over-the-counter or prescription medicines for pain, discomfort, or fever as directed by your caregiver.  SEEK IMMEDIATE MEDICAL CARE IF:   · You have pain in your eye.  · Your vision changes.  · The chalazion does not go away.  · The chalazion becomes painful, red, or swollen, grows larger, or does not start to disappear after 2 weeks.  MAKE SURE YOU:   · Understand these instructions.  · Will watch your condition.  · Will get help right away if you are not doing well or get worse.  Document Released: 12/10/2000 Document Revised: 03/06/2012 Document Reviewed:  03/30/2010  [employer]® Patient Information ©2014 [employer].

## 2014-05-14 NOTE — Assessment & Plan Note (Signed)
Up-to-date on all preventive measures. Discussed that he does not need another pneumococcal vaccine.

## 2014-05-14 NOTE — Assessment & Plan Note (Signed)
7 days of oral doxycycline with topical warm compresses.  Return in week if this is no better.

## 2014-05-21 ENCOUNTER — Encounter: Payer: Self-pay | Admitting: Sports Medicine

## 2014-05-21 MED ORDER — SULFAMETHOXAZOLE-TRIMETHOPRIM 800-160 MG PO TABS: 1.0000 | ORAL_TABLET | Freq: Two times a day (BID) | ORAL | Status: DC

## 2014-05-31 ENCOUNTER — Encounter: Payer: Self-pay | Admitting: Sports Medicine

## 2014-06-19 ENCOUNTER — Other Ambulatory Visit: Payer: Self-pay | Admitting: Sports Medicine

## 2014-06-19 MED ORDER — COLESTIPOL HCL 1 G PO TABS: 3.0000 g | ORAL_TABLET | Freq: Two times a day (BID) | ORAL | Status: DC

## 2014-07-27 ENCOUNTER — Encounter: Payer: Self-pay | Admitting: Sports Medicine

## 2014-07-29 ENCOUNTER — Other Ambulatory Visit: Payer: Self-pay | Admitting: Sports Medicine

## 2014-07-29 MED ORDER — TADALAFIL 5 MG PO TABS: 5.0000 mg | ORAL_TABLET | Freq: Every day | ORAL | Status: DC

## 2014-08-05 ENCOUNTER — Telehealth: Payer: Self-pay | Admitting: *Deleted

## 2014-08-05 MED ORDER — TADALAFIL 5 MG PO TABS: 5.0000 mg | ORAL_TABLET | Freq: Every day | ORAL | Status: DC

## 2014-08-05 NOTE — Telephone Encounter (Signed)
Patient and pharmacy notfied of approval. Margette Fast, CMA

## 2014-09-03 ENCOUNTER — Other Ambulatory Visit: Payer: Self-pay

## 2014-09-03 MED ORDER — TERAZOSIN HCL 2 MG PO CAPS: 2.0000 mg | ORAL_CAPSULE | Freq: Every day | ORAL | Status: DC

## 2014-09-03 NOTE — Telephone Encounter (Signed)
Left a message for patient to call and schedule a Medicare Wellness.

## 2014-09-26 ENCOUNTER — Encounter: Payer: Self-pay | Admitting: Sports Medicine

## 2014-09-27 DIAGNOSIS — Z23 Encounter for immunization: Secondary | ICD-10-CM | POA: Diagnosis not present

## 2014-10-14 DIAGNOSIS — Z961 Presence of intraocular lens: Secondary | ICD-10-CM | POA: Diagnosis not present

## 2014-10-14 DIAGNOSIS — H43813 Vitreous degeneration, bilateral: Secondary | ICD-10-CM | POA: Diagnosis not present

## 2014-10-14 DIAGNOSIS — H26493 Other secondary cataract, bilateral: Secondary | ICD-10-CM | POA: Diagnosis not present

## 2014-10-14 DIAGNOSIS — D3132 Benign neoplasm of left choroid: Secondary | ICD-10-CM | POA: Diagnosis not present

## 2014-10-14 DIAGNOSIS — H1851 Endothelial corneal dystrophy: Secondary | ICD-10-CM | POA: Diagnosis not present

## 2014-10-14 DIAGNOSIS — H4913 Fourth [trochlear] nerve palsy, bilateral: Secondary | ICD-10-CM | POA: Diagnosis not present

## 2014-11-08 DIAGNOSIS — L578 Other skin changes due to chronic exposure to nonionizing radiation: Secondary | ICD-10-CM | POA: Diagnosis not present

## 2014-11-08 DIAGNOSIS — L820 Inflamed seborrheic keratosis: Secondary | ICD-10-CM | POA: Diagnosis not present

## 2014-11-08 DIAGNOSIS — D485 Neoplasm of uncertain behavior of skin: Secondary | ICD-10-CM | POA: Diagnosis not present

## 2014-11-08 DIAGNOSIS — L570 Actinic keratosis: Secondary | ICD-10-CM | POA: Diagnosis not present

## 2014-11-29 DIAGNOSIS — L570 Actinic keratosis: Secondary | ICD-10-CM | POA: Diagnosis not present

## 2014-11-29 DIAGNOSIS — L578 Other skin changes due to chronic exposure to nonionizing radiation: Secondary | ICD-10-CM | POA: Diagnosis not present

## 2014-11-29 DIAGNOSIS — L820 Inflamed seborrheic keratosis: Secondary | ICD-10-CM | POA: Diagnosis not present

## 2015-01-07 ENCOUNTER — Other Ambulatory Visit: Payer: Self-pay | Admitting: *Deleted

## 2015-01-07 MED ORDER — OMEPRAZOLE 10 MG PO CPDR: 10.0000 mg | DELAYED_RELEASE_CAPSULE | Freq: Every day | ORAL | Status: DC

## 2015-02-04 ENCOUNTER — Encounter: Payer: Self-pay | Admitting: Sports Medicine

## 2015-02-04 DIAGNOSIS — N400 Enlarged prostate without lower urinary tract symptoms: Secondary | ICD-10-CM

## 2015-02-04 DIAGNOSIS — R7302 Impaired glucose tolerance (oral): Secondary | ICD-10-CM

## 2015-02-04 DIAGNOSIS — E785 Hyperlipidemia, unspecified: Secondary | ICD-10-CM

## 2015-02-06 DIAGNOSIS — R7302 Impaired glucose tolerance (oral): Secondary | ICD-10-CM | POA: Diagnosis not present

## 2015-02-06 DIAGNOSIS — N400 Enlarged prostate without lower urinary tract symptoms: Secondary | ICD-10-CM | POA: Diagnosis not present

## 2015-02-06 DIAGNOSIS — E785 Hyperlipidemia, unspecified: Secondary | ICD-10-CM | POA: Diagnosis not present

## 2015-02-06 LAB — LIPID PANEL
Cholesterol: 191 mg/dL (ref 0–200)
HDL: 65 mg/dL (ref >39)
LDL Cholesterol: 109 mg/dL — ABNORMAL HIGH (ref 0–99)
Total CHOL/HDL Ratio: 2.9 Ratio
Triglycerides: 86 mg/dL (ref <150)
VLDL: 17 mg/dL (ref 0–40)

## 2015-02-06 LAB — CBC
HCT: 44.4 % (ref 39.0–52.0)
Hemoglobin: 15 g/dL (ref 13.0–17.0)
MCH: 32.9 pg (ref 26.0–34.0)
MCHC: 33.8 g/dL (ref 30.0–36.0)
MCV: 97.4 fL (ref 78.0–100.0)
MPV: 11.4 fL (ref 8.6–12.4)
Platelets: 186 10*3/uL (ref 150–400)
RBC: 4.56 MIL/uL (ref 4.22–5.81)
RDW: 12.8 % (ref 11.5–15.5)
WBC: 6.2 10*3/uL (ref 4.0–10.5)

## 2015-02-06 LAB — COMPREHENSIVE METABOLIC PANEL
ALT: 11 U/L (ref 0–53)
AST: 18 U/L (ref 0–37)
Albumin: 4.2 g/dL (ref 3.5–5.2)
Alkaline Phosphatase: 47 U/L (ref 39–117)
BUN: 22 mg/dL (ref 6–23)
CO2: 29 mEq/L (ref 19–32)
Calcium: 9.4 mg/dL (ref 8.4–10.5)
Chloride: 102 mEq/L (ref 96–112)
Creat: 1.27 mg/dL (ref 0.50–1.35)
Glucose, Bld: 97 mg/dL (ref 70–99)
Potassium: 4.5 mEq/L (ref 3.5–5.3)
Sodium: 141 mEq/L (ref 135–145)
Total Bilirubin: 0.8 mg/dL (ref 0.2–1.2)
Total Protein: 7.1 g/dL (ref 6.0–8.3)

## 2015-02-06 LAB — HEMOGLOBIN A1C
Hgb A1c MFr Bld: 5.5 % (ref <5.7)
Mean Plasma Glucose: 111 mg/dL (ref <117)

## 2015-02-06 LAB — TSH: TSH: 2.52 u[IU]/mL (ref 0.350–4.500)

## 2015-02-06 LAB — URIC ACID: Uric Acid, Serum: 5.9 mg/dL (ref 4.0–7.8)

## 2015-02-07 LAB — PSA, TOTAL AND FREE
PSA, Free Pct: 23 % — ABNORMAL LOW (ref >25)
PSA, Free: 1.46 ng/mL
PSA: 6.27 ng/mL — ABNORMAL HIGH (ref <=4.00)

## 2015-02-07 LAB — TESTOSTERONE, FREE, TOTAL, SHBG
Sex Hormone Binding: 53 nmol/L (ref 22–77)
Testosterone, Free: 63 pg/mL (ref 47.0–244.0)
Testosterone-% Free: 1.5 % — ABNORMAL LOW (ref 1.6–2.9)
Testosterone: 422 ng/dL (ref 300–890)

## 2015-02-18 ENCOUNTER — Encounter: Payer: Self-pay | Admitting: Sports Medicine

## 2015-02-18 ENCOUNTER — Ambulatory Visit (INDEPENDENT_AMBULATORY_CARE_PROVIDER_SITE_OTHER): Payer: Medicare Other | Admitting: Sports Medicine

## 2015-02-18 VITALS — BP 128/81 | HR 65 | Ht 73.0 in | Wt 189.0 lb

## 2015-02-18 DIAGNOSIS — H6123 Impacted cerumen, bilateral: Secondary | ICD-10-CM

## 2015-02-18 DIAGNOSIS — R195 Other fecal abnormalities: Secondary | ICD-10-CM

## 2015-02-18 DIAGNOSIS — Z1211 Encounter for screening for malignant neoplasm of colon: Secondary | ICD-10-CM | POA: Diagnosis not present

## 2015-02-18 DIAGNOSIS — Z0000 Encounter for general adult medical examination without abnormal findings: Secondary | ICD-10-CM

## 2015-02-18 DIAGNOSIS — E785 Hyperlipidemia, unspecified: Secondary | ICD-10-CM

## 2015-02-18 DIAGNOSIS — N400 Enlarged prostate without lower urinary tract symptoms: Secondary | ICD-10-CM | POA: Diagnosis not present

## 2015-02-18 DIAGNOSIS — M1000 Idiopathic gout, unspecified site: Secondary | ICD-10-CM

## 2015-02-18 LAB — HEMOCCULT GUIAC POC 1CARD (OFFICE): Fecal Occult Blood, POC: POSITIVE

## 2015-02-18 MED ORDER — ATORVASTATIN CALCIUM 20 MG PO TABS: 20.0000 mg | ORAL_TABLET | Freq: Every day | ORAL | Status: DC

## 2015-02-18 NOTE — Assessment & Plan Note (Signed)
Continue febuxostat, return as needed.

## 2015-02-18 NOTE — Assessment & Plan Note (Signed)
Lipids have increased, we are going to start low-dose Lipitor per patient request.

## 2015-02-18 NOTE — Assessment & Plan Note (Addendum)
Slightly elevated PSA but low velocity, considering age, no changes in plan. Also a history of proteinuria, rechecking a urinalysis

## 2015-02-18 NOTE — Assessment & Plan Note (Signed)
Continues to be well-controlled with colestipol

## 2015-02-18 NOTE — Progress Notes (Signed)
Subjective:    Steven Pearson is a 81 y.o. male who presents for Medicare Annual/Subsequent preventive examination.   Preventive Screening-Counseling & Management  Tobacco History  Smoking status  . Former Smoker -- 1.50 packs/day for 45 years  . Types: Cigarettes  . Quit date: 08/17/1999  Smokeless tobacco  . Never Used    Problems Prior to Visit 1.   Current Problems (verified) Patient Active Problem List   Diagnosis Date Noted  . Stye 05/14/2014  . Bilateral lower extremity edema 05/14/2014  . Chest pain 10/24/2013  . PVC's (premature ventricular contractions) 10/08/2013  . Stool mucus and excess flatulence. 08/16/2012  . Preventive measure 08/16/2012  . Gout 08/16/2012  . BPH (benign prostatic hypertrophy)   . GERD (gastroesophageal reflux disease)   . Hyperlipidemia     Medications Prior to Visit Current Outpatient Prescriptions on File Prior to Visit  Medication Sig Dispense Refill  . ALPHA LIPOIC ACID PO Take 1 tablet by mouth daily.    . AMBULATORY NON FORMULARY MEDICATION Knee-high, medium compression, graduated compression stockings. Apply to lower extremities. 1 each 0  . aspirin EC 81 MG tablet Take 81 mg by mouth every other day.     . beta carotene w/minerals (OCUVITE) tablet Take 1 tablet by mouth daily.    . Cholecalciferol (VITAMIN D-3) 5000 UNITS TABS Take 1 tablet by mouth every other day.    . colestipol (COLESTID) 1 G tablet Take 3 tablets (3 g total) by mouth 2 (two) times daily. 540 tablet 3  . Cyanocobalamin (B-12 PO) Take 1 tablet by mouth daily.    . febuxostat (ULORIC) 40 MG tablet Take 1 tablet (40 mg total) by mouth daily. 90 tablet 3  . FOLIC ACID PO Take 1 tablet by mouth daily.    . furosemide (LASIX) 40 MG tablet One tab up to twice a week for lower extremity swelling 90 tablet 3  . hydrochlorothiazide (HYDRODIURIL) 12.5 MG tablet Take 1 tablet (12.5 mg total) by mouth 2 (two) times daily. 180 tablet 3  . Multiple Vitamin (MULTIVITAMIN  WITH MINERALS) TABS tablet Take 1 tablet by mouth daily.    . NON FORMULARY Kefir 3 oz daily    . omeprazole (PRILOSEC) 10 MG capsule Take 1 capsule (10 mg total) by mouth daily. 90 capsule 1  . Probiotic Product (PROBIOTIC DAILY PO) Take 1 tablet by mouth daily. Kefir    . sulfamethoxazole-trimethoprim (BACTRIM DS,SEPTRA DS) 800-160 MG per tablet Take 1 tablet by mouth 2 (two) times daily. 14 tablet 0  . tadalafil (CIALIS) 5 MG tablet Take 1 tablet (5 mg total) by mouth daily. 90 tablet 3  . terazosin (HYTRIN) 2 MG capsule Take 1 capsule (2 mg total) by mouth at bedtime. 90 capsule 3  . Ubiquinol 100 MG CAPS Take 1 tablet by mouth daily.    Marland Kitchen VITAMIN E PO Take 1 tablet by mouth daily.    Marland Kitchen VITAMIN K PO Take 1 tablet by mouth daily.     No current facility-administered medications on file prior to visit.    Current Medications (verified) Current Outpatient Prescriptions  Medication Sig Dispense Refill  . ALPHA LIPOIC ACID PO Take 1 tablet by mouth daily.    . AMBULATORY NON FORMULARY MEDICATION Knee-high, medium compression, graduated compression stockings. Apply to lower extremities. 1 each 0  . aspirin EC 81 MG tablet Take 81 mg by mouth every other day.     . beta carotene w/minerals (OCUVITE) tablet Take 1 tablet  by mouth daily.    . Cholecalciferol (VITAMIN D-3) 5000 UNITS TABS Take 1 tablet by mouth every other day.    . colestipol (COLESTID) 1 G tablet Take 3 tablets (3 g total) by mouth 2 (two) times daily. 540 tablet 3  . Cyanocobalamin (B-12 PO) Take 1 tablet by mouth daily.    . febuxostat (ULORIC) 40 MG tablet Take 1 tablet (40 mg total) by mouth daily. 90 tablet 3  . FOLIC ACID PO Take 1 tablet by mouth daily.    . furosemide (LASIX) 40 MG tablet One tab up to twice a week for lower extremity swelling 90 tablet 3  . hydrochlorothiazide (HYDRODIURIL) 12.5 MG tablet Take 1 tablet (12.5 mg total) by mouth 2 (two) times daily. 180 tablet 3  . Multiple Vitamin (MULTIVITAMIN WITH  MINERALS) TABS tablet Take 1 tablet by mouth daily.    . NON FORMULARY Kefir 3 oz daily    . omeprazole (PRILOSEC) 10 MG capsule Take 1 capsule (10 mg total) by mouth daily. 90 capsule 1  . Probiotic Product (PROBIOTIC DAILY PO) Take 1 tablet by mouth daily. Kefir    . sulfamethoxazole-trimethoprim (BACTRIM DS,SEPTRA DS) 800-160 MG per tablet Take 1 tablet by mouth 2 (two) times daily. 14 tablet 0  . tadalafil (CIALIS) 5 MG tablet Take 1 tablet (5 mg total) by mouth daily. 90 tablet 3  . terazosin (HYTRIN) 2 MG capsule Take 1 capsule (2 mg total) by mouth at bedtime. 90 capsule 3  . Ubiquinol 100 MG CAPS Take 1 tablet by mouth daily.    Marland Kitchen VITAMIN E PO Take 1 tablet by mouth daily.    Marland Kitchen VITAMIN K PO Take 1 tablet by mouth daily.     No current facility-administered medications for this visit.     Allergies (verified) Review of patient's allergies indicates no known allergies.   PAST HISTORY  Family History Family History  Problem Relation Age of Onset  . Heart disease Father     Pacemaker    Social History History  Substance Use Topics  . Smoking status: Former Smoker -- 1.50 packs/day for 45 years    Types: Cigarettes    Quit date: 08/17/1999  . Smokeless tobacco: Never Used  . Alcohol Use: 3.0 oz/week    5 Glasses of wine per week     Comment: Occasional    Are there smokers in your home (other than you)?  No  Risk Factors Current exercise habits: Home exercise includes regular walking Dietary issues discussed: Not indicated   Cardiac risk factors: advanced age (older than 22 for men, 39 for women) and dyslipidemia.  Depression Screen (Note: if answer to either of the following is "Yes", a more complete depression screening is indicated)   Q1: Over the past two weeks, have you felt down, depressed or hopeless? No  Q2: Over the past two weeks, have you felt little interest or pleasure in doing things? No  Have you lost interest or pleasure in daily life? No  Do you  often feel hopeless? No  Do you cry easily over simple problems? No  Activities of Daily Living In your present state of health, do you have any difficulty performing the following activities?:  Driving? No Managing money?  No Feeding yourself? No Getting from bed to chair? No Climbing a flight of stairs? No Preparing food and eating?: No Bathing or showering? No Getting dressed: No Getting to the toilet? No Using the toilet:No Moving around from place  to place: No In the past year have you fallen or had a near fall?:No   Are you sexually active?  No  Do you have more than one partner?  No  Hearing Difficulties: Yes Do you often ask people to speak up or repeat themselves? Yes Do you experience ringing or noises in your ears? No Do you have difficulty understanding soft or whispered voices? Yes   Do you feel that you have a problem with memory? No  Do you often misplace items? No  Do you feel safe at home?  Yes   Cognitive Testing  Alert? Yes  Normal Appearance?Yes  Oriented to person? Yes  Place? Yes   Time? Yes  Recall of three objects?  Yes  Can perform simple calculations? Yes  Displays appropriate judgment?Yes  Can read the correct time from a watch face?Yes   Advanced Directives have been discussed with the patient? No   List the Names of Other Physician/Practitioners you currently use: 1.    Indicate any recent Medical Services you may have received from other than [facility] providers in the past year (date may be approximate).  Immunization History  Administered Date(s) Administered  . Influenza Split 09/20/2012, 09/24/2013  . Pneumococcal Polysaccharide-23 09/20/2012    Screening Tests Health Maintenance  Topic Date Due  . INFLUENZA VACCINE  07/27/2014  . COLONOSCOPY  12/27/2016  . TETANUS/TDAP  11/26/2019  . PNEUMOCOCCAL POLYSACCHARIDE VACCINE AGE 38 AND OVER  Completed  . ZOSTAVAX  Addressed    All answers were reviewed with the patient and  necessary referrals were made:  Aundria Mems, MD   02/18/2015   History reviewed: allergies, current medications, past family history, past medical history, past social history, past surgical history and problem list  Review of Systems A comprehensive review of systems was negative.    Objective:     Vision by Snellen chart: right eye:20/40, left eye:20/100 There were no vitals taken for this visit. There is no weight on file to calculate BMI. General: Well Developed, well nourished, and in no acute distress.  Neuro: Alert and oriented x3, extra-ocular muscles intact, sensation grossly intact. Cranial nerves II through XII are intact, motor, sensory, and coordinative functions are all intact. HEENT: Normocephalic, atraumatic, pupils equal round reactive to light, neck supple, no masses, no lymphadenopathy, thyroid nonpalpable. Oropharynx, nasopharynx, bilateral cerumen impaction Skin: Warm and dry, no rashes noted.  Cardiac: Regular rate and rhythm, no murmurs rubs or gallops.  Respiratory: Clear to auscultation bilaterally. Not using accessory muscles, speaking in full sentences.  Abdominal: Soft, nontender, nondistended, positive bowel sounds, no masses, no organomegaly.  Musculoskeletal: Shoulder, elbow, wrist, hip, knee, ankle stable, and with full range of motion.  Indication: Cerumen impaction of the ear(s) Medical necessity statement: On physical examination, cerumen impairs clinically significant portions of the external auditory canal, and tympanic membrane. Noted obstructive, copious cerumen that cannot be removed without magnification and instrumentations requiring physician skills Consent: Discussed benefits and risks of procedure and verbal consent obtained Procedure: Patient was prepped for the procedure. Utilized an otoscope to assess and take note of the ear canal, the tympanic membrane, and the presence, amount, and placement of the cerumen. Gentle water irrigation  and soft plastic curette was utilized to remove cerumen.  Post procedure examination: shows cerumen was completely removed. Patient tolerated procedure well. The patient is made aware that they may experience temporary vertigo, temporary hearing loss, and temporary discomfort. If these symptom last for more than 24 hours to call  the clinic or proceed to the ED.   Assessment:     Healthy male, see below for further assessment and plan     Plan:     During the course of the visit the patient was educated and counseled about appropriate screening and preventive services including:    See below for further assessment and plan  Diet review for nutrition referral? Yes ____  Not Indicated __x__   Patient Instructions (the written plan) was given to the patient.  Medicare Attestation I have personally reviewed: The patient's medical and social history Their use of alcohol, tobacco or illicit drugs Their current medications and supplements The patient's functional ability including ADLs,fall risks, home safety risks, cognitive, and hearing and visual impairment Diet and physical activities Evidence for depression or mood disorders  The patient's weight, height, BMI, and visual acuity have been recorded in the chart.  I have made referrals, counseling, and provided education to the patient based on review of the above and I have provided the patient with a written personalized care plan for preventive services.     Aundria Mems, MD   02/18/2015

## 2015-02-18 NOTE — Assessment & Plan Note (Signed)
Medicare physical today.

## 2015-02-19 LAB — URINALYSIS
Bilirubin Urine: NEGATIVE
Glucose, UA: NEGATIVE mg/dL
Hgb urine dipstick: NEGATIVE
Ketones, ur: NEGATIVE mg/dL
Leukocytes, UA: NEGATIVE
Nitrite: NEGATIVE
Protein, ur: NEGATIVE mg/dL
Specific Gravity, Urine: 1.012 (ref 1.005–1.030)
Urobilinogen, UA: 0.2 mg/dL (ref 0.0–1.0)
pH: 6 (ref 5.0–8.0)

## 2015-03-17 ENCOUNTER — Other Ambulatory Visit: Payer: Self-pay | Admitting: *Deleted

## 2015-03-17 MED ORDER — COLESTIPOL HCL 1 G PO TABS: 3.0000 g | ORAL_TABLET | Freq: Two times a day (BID) | ORAL | Status: DC

## 2015-03-17 MED ORDER — FEBUXOSTAT 40 MG PO TABS: 40.0000 mg | ORAL_TABLET | Freq: Every day | ORAL | Status: DC

## 2015-03-17 MED ORDER — HYDROCHLOROTHIAZIDE 12.5 MG PO TABS: 12.5000 mg | ORAL_TABLET | Freq: Two times a day (BID) | ORAL | Status: DC

## 2015-07-15 ENCOUNTER — Other Ambulatory Visit: Payer: Self-pay | Admitting: Sports Medicine

## 2015-07-15 MED ORDER — TADALAFIL 5 MG PO TABS: 5.0000 mg | ORAL_TABLET | Freq: Every day | ORAL | Status: DC

## 2015-07-15 MED ORDER — OMEPRAZOLE 10 MG PO CPDR: 10.0000 mg | DELAYED_RELEASE_CAPSULE | Freq: Every day | ORAL | Status: DC

## 2015-09-23 ENCOUNTER — Telehealth: Payer: Self-pay | Admitting: Sports Medicine

## 2015-09-23 NOTE — Telephone Encounter (Signed)
Received form by fax from [facility] filled out form and faxed to [facility] they will respond with authorization within 5 business days. - CF

## 2015-09-30 ENCOUNTER — Encounter: Payer: Self-pay | Admitting: Sports Medicine

## 2015-10-02 ENCOUNTER — Encounter: Payer: Self-pay | Admitting: Sports Medicine

## 2015-10-13 ENCOUNTER — Ambulatory Visit (INDEPENDENT_AMBULATORY_CARE_PROVIDER_SITE_OTHER): Payer: Medicare Other

## 2015-10-13 ENCOUNTER — Encounter: Payer: Self-pay | Admitting: Sports Medicine

## 2015-10-13 ENCOUNTER — Ambulatory Visit (INDEPENDENT_AMBULATORY_CARE_PROVIDER_SITE_OTHER): Payer: Medicare Other | Admitting: Sports Medicine

## 2015-10-13 VITALS — BP 153/83 | HR 72 | Temp 97.7°F | Wt 198.0 lb

## 2015-10-13 DIAGNOSIS — M545 Low back pain, unspecified: Secondary | ICD-10-CM

## 2015-10-13 DIAGNOSIS — R600 Localized edema: Secondary | ICD-10-CM

## 2015-10-13 LAB — POCT URINALYSIS DIPSTICK
Bilirubin, UA: NEGATIVE
Glucose, UA: NEGATIVE
Ketones, UA: NEGATIVE
Leukocytes, UA: NEGATIVE
Nitrite, UA: NEGATIVE
Protein, UA: 30
Spec Grav, UA: 1.02
Urobilinogen, UA: 0.2
pH, UA: 6

## 2015-10-13 MED ORDER — MELOXICAM 15 MG PO TABS: ORAL_TABLET | ORAL | Status: DC

## 2015-10-13 NOTE — Progress Notes (Signed)
  Subjective:    CC: low back pain  HPI: For the past couple of weeks this pleasant 81 year old male has had pain he localizes in the left side of his low back, over the quadratus lumborum without radiation, worse with sitting, flexion, Valsalva, no bowel or bladder dysfunction, saddle numbness, or constitutional symptoms.  Past medical history, Surgical history, Family history not pertinant except as noted below, Social history, Allergies, and medications have been entered into the medical record, reviewed, and no changes needed.   Review of Systems: No fevers, chills, night sweats, weight loss, chest pain, or shortness of breath.   Objective:    General: Well Developed, well nourished, and in no acute distress.  Neuro: Alert and oriented x3, extra-ocular muscles intact, sensation grossly intact.  HEENT: Normocephalic, atraumatic, pupils equal round reactive to light, neck supple, no masses, no lymphadenopathy, thyroid nonpalpable.  Skin: Warm and dry, no rashes. Cardiac: Regular rate and rhythm, no murmurs rubs or gallops, no lower extremity edema.  Respiratory: Clear to auscultation bilaterally. Not using accessory muscles, speaking in full sentences. Back Exam:  Inspection: Unremarkable  Motion: Flexion 45 deg, Extension 45 deg, Side Bending to 45 deg bilaterally,  Rotation to 45 deg bilaterally  SLR laying: Negative  XSLR laying: Negative  Palpable tenderness: None. FABER: negative. Sensory change: Gross sensation intact to all lumbar and sacral dermatomes.  Reflexes: 2+ at both patellar tendons, 2+ at achilles tendons, Babinski's downgoing.  Strength at foot  Plantar-flexion: 5/5 Dorsi-flexion: 5/5 Eversion: 5/5 Inversion: 5/5  Leg strength  Quad: 5/5 Hamstring: 5/5 Hip flexor: 5/5 Hip abductors: 5/5  Gait unremarkable.  Impression and Recommendations:

## 2015-10-13 NOTE — Assessment & Plan Note (Signed)
Unable to void and with increased lower extremity swelling secondary to self discontinuation of hydrochlorothiazide, he needs to restart this.

## 2015-10-13 NOTE — Assessment & Plan Note (Signed)
Left quadratus lumborum strain. Awaiting urinalysis, patient will drop off a urine container when he is able to void. Meloxicam, formal physical therapy, x-rays, return in 6 weeks.

## 2015-10-25 ENCOUNTER — Encounter: Payer: Self-pay | Admitting: Sports Medicine

## 2015-10-27 ENCOUNTER — Encounter: Payer: Self-pay | Admitting: Sports Medicine

## 2015-10-27 NOTE — Telephone Encounter (Signed)
Received another form by fax so I filled out form and faxed back to [facility]. - CF

## 2015-10-28 ENCOUNTER — Encounter: Payer: Self-pay | Admitting: Sports Medicine

## 2015-10-28 ENCOUNTER — Other Ambulatory Visit: Payer: Self-pay | Admitting: Sports Medicine

## 2015-10-28 MED ORDER — TADALAFIL 5 MG PO TABS: 5.0000 mg | ORAL_TABLET | Freq: Every day | ORAL | Status: DC

## 2015-10-28 NOTE — Telephone Encounter (Signed)
Any ideas?  I'll send it again.

## 2015-11-06 NOTE — Telephone Encounter (Signed)
Received approval on Cialis. Pharmacy and Patient notified.

## 2015-12-05 ENCOUNTER — Other Ambulatory Visit: Payer: Self-pay

## 2015-12-05 DIAGNOSIS — E785 Hyperlipidemia, unspecified: Secondary | ICD-10-CM

## 2015-12-05 MED ORDER — ATORVASTATIN CALCIUM 20 MG PO TABS: 20.0000 mg | ORAL_TABLET | Freq: Every day | ORAL | Status: DC

## 2015-12-05 MED ORDER — OMEPRAZOLE 10 MG PO CPDR: 10.0000 mg | DELAYED_RELEASE_CAPSULE | Freq: Every day | ORAL | Status: DC

## 2016-03-01 ENCOUNTER — Encounter: Payer: Self-pay | Admitting: Sports Medicine

## 2016-03-01 MED ORDER — RANITIDINE HCL 150 MG PO TABS: 150.0000 mg | ORAL_TABLET | Freq: Two times a day (BID) | ORAL | Status: DC

## 2016-03-05 ENCOUNTER — Encounter: Payer: Self-pay | Admitting: *Deleted

## 2016-03-30 ENCOUNTER — Encounter: Payer: Self-pay | Admitting: Sports Medicine

## 2016-03-30 DIAGNOSIS — Z0000 Encounter for general adult medical examination without abnormal findings: Secondary | ICD-10-CM

## 2016-03-31 ENCOUNTER — Other Ambulatory Visit: Payer: Self-pay

## 2016-03-31 MED ORDER — HYDROCHLOROTHIAZIDE 12.5 MG PO TABS: 12.5000 mg | ORAL_TABLET | Freq: Two times a day (BID) | ORAL | Status: DC

## 2016-03-31 MED ORDER — OMEPRAZOLE 10 MG PO CPDR: 10.0000 mg | DELAYED_RELEASE_CAPSULE | Freq: Every day | ORAL | Status: DC

## 2016-03-31 MED ORDER — COLESTIPOL HCL 1 G PO TABS: 3.0000 g | ORAL_TABLET | Freq: Two times a day (BID) | ORAL | Status: DC

## 2016-04-01 LAB — CBC
HCT: 42.7 % (ref 38.5–50.0)
Hemoglobin: 14.1 g/dL (ref 13.2–17.1)
MCH: 32.3 pg (ref 27.0–33.0)
MCHC: 33 g/dL (ref 32.0–36.0)
MCV: 97.9 fL (ref 80.0–100.0)
MPV: 12.3 fL (ref 7.5–12.5)
Platelets: 177 K/uL (ref 140–400)
RBC: 4.36 MIL/uL (ref 4.20–5.80)
RDW: 12.1 % (ref 11.0–15.0)
WBC: 7 K/uL (ref 3.8–10.8)

## 2016-04-01 LAB — COMPREHENSIVE METABOLIC PANEL WITH GFR
ALT: 12 U/L (ref 9–46)
AST: 17 U/L (ref 10–35)
Albumin: 4.1 g/dL (ref 3.6–5.1)
Alkaline Phosphatase: 46 U/L (ref 40–115)
BUN: 25 mg/dL (ref 7–25)
CO2: 28 mmol/L (ref 20–31)
Calcium: 9.2 mg/dL (ref 8.6–10.3)
Chloride: 101 mmol/L (ref 98–110)
Creat: 1.68 mg/dL — ABNORMAL HIGH (ref 0.70–1.11)
Glucose, Bld: 91 mg/dL (ref 65–99)
Potassium: 4.4 mmol/L (ref 3.5–5.3)
Sodium: 140 mmol/L (ref 135–146)
Total Bilirubin: 0.9 mg/dL (ref 0.2–1.2)
Total Protein: 7 g/dL (ref 6.1–8.1)

## 2016-04-01 LAB — LIPID PANEL
Cholesterol: 133 mg/dL (ref 125–200)
HDL: 60 mg/dL (ref >=40)
LDL Cholesterol: 47 mg/dL (ref <130)
Total CHOL/HDL Ratio: 2.2 Ratio (ref <=5.0)
Triglycerides: 128 mg/dL (ref <150)
VLDL: 26 mg/dL (ref <30)

## 2016-04-01 LAB — C-REACTIVE PROTEIN: CRP: <0.5 mg/dL (ref <0.60)

## 2016-04-01 LAB — TSH: TSH: 3.11 m[IU]/L (ref 0.40–4.50)

## 2016-04-01 LAB — URIC ACID: Uric Acid, Serum: 5.6 mg/dL (ref 4.0–7.8)

## 2016-04-02 LAB — HEMOGLOBIN A1C
Hgb A1c MFr Bld: 5.8 % — ABNORMAL HIGH (ref <5.7)
Mean Plasma Glucose: 120 mg/dL

## 2016-04-02 LAB — FIBRINOGEN: Fibrinogen: 275 mg/dL (ref 204–475)

## 2016-04-02 LAB — PSA, TOTAL AND FREE
PSA, Free Pct: 29 % (ref >25)
PSA, Free: 1.69 ng/mL
PSA: 5.92 ng/mL — ABNORMAL HIGH (ref <=4.00)

## 2016-04-02 LAB — HOMOCYSTEINE: Homocysteine: 15.6 umol/L — ABNORMAL HIGH (ref <11.4)

## 2016-04-06 ENCOUNTER — Encounter: Payer: Self-pay | Admitting: Sports Medicine

## 2016-04-06 LAB — ESTROGENS, TOTAL: Estrogen: 339 pg/mL — ABNORMAL HIGH (ref 60–190)

## 2016-04-07 LAB — DHEA: DHEA: 50 ng/dL — ABNORMAL LOW (ref 61–1636)

## 2016-04-08 ENCOUNTER — Encounter: Payer: Self-pay | Admitting: Sports Medicine

## 2016-04-14 ENCOUNTER — Ambulatory Visit (INDEPENDENT_AMBULATORY_CARE_PROVIDER_SITE_OTHER): Payer: Medicare Other | Admitting: Sports Medicine

## 2016-04-14 ENCOUNTER — Encounter: Payer: Self-pay | Admitting: Sports Medicine

## 2016-04-14 VITALS — BP 142/76 | HR 80 | Resp 18 | Ht 73.0 in | Wt 188.4 lb

## 2016-04-14 DIAGNOSIS — R001 Bradycardia, unspecified: Secondary | ICD-10-CM | POA: Diagnosis not present

## 2016-04-14 DIAGNOSIS — R600 Localized edema: Secondary | ICD-10-CM | POA: Diagnosis not present

## 2016-04-14 DIAGNOSIS — N400 Enlarged prostate without lower urinary tract symptoms: Secondary | ICD-10-CM | POA: Diagnosis not present

## 2016-04-14 DIAGNOSIS — I10 Essential (primary) hypertension: Secondary | ICD-10-CM

## 2016-04-14 DIAGNOSIS — Z0000 Encounter for general adult medical examination without abnormal findings: Secondary | ICD-10-CM

## 2016-04-14 DIAGNOSIS — R609 Edema, unspecified: Secondary | ICD-10-CM | POA: Diagnosis not present

## 2016-04-14 DIAGNOSIS — R809 Proteinuria, unspecified: Secondary | ICD-10-CM

## 2016-04-14 DIAGNOSIS — E785 Hyperlipidemia, unspecified: Secondary | ICD-10-CM

## 2016-04-14 LAB — BASIC METABOLIC PANEL
BUN: 29 mg/dL — ABNORMAL HIGH (ref 7–25)
CO2: 27 mmol/L (ref 20–31)
Calcium: 9.5 mg/dL (ref 8.6–10.3)
Chloride: 98 mmol/L (ref 98–110)
Creat: 1.78 mg/dL — ABNORMAL HIGH (ref 0.70–1.11)
Glucose, Bld: 85 mg/dL (ref 65–99)
Potassium: 4.1 mmol/L (ref 3.5–5.3)
Sodium: 136 mmol/L (ref 135–146)

## 2016-04-14 MED ORDER — TAMSULOSIN HCL 0.4 MG PO CAPS: 0.4000 mg | ORAL_CAPSULE | Freq: Every day | ORAL | Status: DC

## 2016-04-14 NOTE — Assessment & Plan Note (Signed)
Having a bit of nighttime incontinence.  this likely represents overflow incontinence  adding Flomax.

## 2016-04-14 NOTE — Assessment & Plan Note (Signed)
Asymptomatic, no further intervention needed. 

## 2016-04-14 NOTE — Assessment & Plan Note (Signed)
Medicare physical as above, pneumococcal 13 given today.

## 2016-04-14 NOTE — Progress Notes (Signed)
Subjective:    Steven Pearson is a 82 y.o. male who presents for Medicare Annual/Subsequent preventive examination.   Preventive Screening-Counseling & Management  Tobacco History  Smoking status  . Former Smoker -- 1.50 packs/day for 45 years  . Types: Cigarettes  . Quit date: 08/17/1999  Smokeless tobacco  . Never Used    Problems Prior to Visit Hyperlipidemia: Stable BPH: Having some nighttime incontinence, agreeable to start Flomax. Bradycardia: asymptomatic , normal EKG, echo, stress test. Lower extremity edema: Stable with HCTZ.  Current Problems (verified) Patient Active Problem List   Diagnosis Date Noted  . Acute low back pain 10/13/2015  . Bilateral lower extremity edema 05/14/2014  . Chest pain 10/24/2013  . PVC's (premature ventricular contractions) 10/08/2013  . Stool mucus and excess flatulence. 08/16/2012  . Annual physical exam 08/16/2012  . Gout 08/16/2012  . BPH (benign prostatic hypertrophy)   . GERD (gastroesophageal reflux disease)   . Hyperlipidemia     Medications Prior to Visit Current Outpatient Prescriptions on File Prior to Visit  Medication Sig Dispense Refill  . AMBULATORY NON FORMULARY MEDICATION Knee-high, medium compression, graduated compression stockings. Apply to lower extremities. 1 each 0  . atorvastatin (LIPITOR) 20 MG tablet Take 1 tablet (20 mg total) by mouth daily. 90 tablet 3  . beta carotene w/minerals (OCUVITE) tablet Take 1 tablet by mouth daily.    . Cholecalciferol (VITAMIN D-3) 5000 UNITS TABS Take 1 tablet by mouth every other day.    . colestipol (COLESTID) 1 g tablet Take 3 tablets (3 g total) by mouth 2 (two) times daily. 540 tablet 3  . Cyanocobalamin (B-12 PO) Take 1 tablet by mouth daily.    . febuxostat (ULORIC) 40 MG tablet Take 1 tablet (40 mg total) by mouth daily. 90 tablet 3  . FOLIC ACID PO Take 1 tablet by mouth daily.    . hydrochlorothiazide (HYDRODIURIL) 12.5 MG tablet Take 1 tablet (12.5 mg total) by  mouth 2 (two) times daily. 180 tablet 3  . meloxicam (MOBIC) 15 MG tablet One tab PO qAM with breakfast for 2 weeks, then daily prn pain. 30 tablet 3  . Multiple Vitamin (MULTIVITAMIN WITH MINERALS) TABS tablet Take 1 tablet by mouth daily.    . NON FORMULARY Kefir 3 oz daily    . omeprazole (PRILOSEC) 10 MG capsule Take 1 capsule (10 mg total) by mouth daily. 90 capsule 3  . Probiotic Product (PROBIOTIC DAILY PO) Take 1 tablet by mouth daily. Kefir    . ranitidine (ZANTAC) 150 MG tablet Take 1 tablet (150 mg total) by mouth 2 (two) times daily. 180 tablet 3  . tadalafil (CIALIS) 5 MG tablet Take 1 tablet (5 mg total) by mouth daily. 90 tablet 3  . Ubiquinol 100 MG CAPS Take 1 tablet by mouth daily.    Marland Kitchen VITAMIN E PO Take 1 tablet by mouth daily.    Marland Kitchen VITAMIN K PO Take 1 tablet by mouth daily.     No current facility-administered medications on file prior to visit.    Current Medications (verified) Current Outpatient Prescriptions  Medication Sig Dispense Refill  . AMBULATORY NON FORMULARY MEDICATION Knee-high, medium compression, graduated compression stockings. Apply to lower extremities. 1 each 0  . atorvastatin (LIPITOR) 20 MG tablet Take 1 tablet (20 mg total) by mouth daily. 90 tablet 3  . beta carotene w/minerals (OCUVITE) tablet Take 1 tablet by mouth daily.    . Cholecalciferol (VITAMIN D-3) 5000 UNITS TABS Take 1 tablet by  mouth every other day.    . colestipol (COLESTID) 1 g tablet Take 3 tablets (3 g total) by mouth 2 (two) times daily. 540 tablet 3  . Cyanocobalamin (B-12 PO) Take 1 tablet by mouth daily.    . febuxostat (ULORIC) 40 MG tablet Take 1 tablet (40 mg total) by mouth daily. 90 tablet 3  . FOLIC ACID PO Take 1 tablet by mouth daily.    . hydrochlorothiazide (HYDRODIURIL) 12.5 MG tablet Take 1 tablet (12.5 mg total) by mouth 2 (two) times daily. 180 tablet 3  . meloxicam (MOBIC) 15 MG tablet One tab PO qAM with breakfast for 2 weeks, then daily prn pain. 30 tablet 3   . Multiple Vitamin (MULTIVITAMIN WITH MINERALS) TABS tablet Take 1 tablet by mouth daily.    . NON FORMULARY Kefir 3 oz daily    . omeprazole (PRILOSEC) 10 MG capsule Take 1 capsule (10 mg total) by mouth daily. 90 capsule 3  . Probiotic Product (PROBIOTIC DAILY PO) Take 1 tablet by mouth daily. Kefir    . ranitidine (ZANTAC) 150 MG tablet Take 1 tablet (150 mg total) by mouth 2 (two) times daily. 180 tablet 3  . tadalafil (CIALIS) 5 MG tablet Take 1 tablet (5 mg total) by mouth daily. 90 tablet 3  . Ubiquinol 100 MG CAPS Take 1 tablet by mouth daily.    Marland Kitchen VITAMIN E PO Take 1 tablet by mouth daily.    Marland Kitchen VITAMIN K PO Take 1 tablet by mouth daily.     No current facility-administered medications for this visit.     Allergies (verified) Review of patient's allergies indicates no known allergies.   PAST HISTORY  Family History Family History  Problem Relation Age of Onset  . Heart disease Father     Pacemaker    Social History Social History  Substance Use Topics  . Smoking status: Former Smoker -- 1.50 packs/day for 45 years    Types: Cigarettes    Quit date: 08/17/1999  . Smokeless tobacco: Never Used  . Alcohol Use: 3.0 oz/week    5 Glasses of wine per week     Comment: Occasional    Are there smokers in your home (other than you)?  No  Risk Factors Current exercise habits: Doing treadmill aerobics 2 or 3 times a week, needs to add resistance training  Dietary issues discussed: Yes   Cardiac risk factors: advanced age (older than 77 for men, 26 for women) and male gender.  Depression Screen (Note: if answer to either of the following is "Yes", a more complete depression screening is indicated)   Q1: Over the past two weeks, have you felt down, depressed or hopeless? No  Q2: Over the past two weeks, have you felt little interest or pleasure in doing things? No  Have you lost interest or pleasure in daily life? No  Do you often feel hopeless? No  Do you cry easily  over simple problems? No  Activities of Daily Living In your present state of health, do you have any difficulty performing the following activities?:  Driving? No Managing money?  No Feeding yourself? No Getting from bed to chair? No Climbing a flight of stairs? No Preparing food and eating?: No Bathing or showering? No Getting dressed: No Getting to the toilet? No Using the toilet:No Moving around from place to place: No In the past year have you fallen or had a near fall?:No   Are you sexually active?  No  Do you have more than one partner?  No  Hearing Difficulties: Yes Do you often ask people to speak up or repeat themselves? Yes Do you experience ringing or noises in your ears? Yes Do you have difficulty understanding soft or whispered voices? Yes   Do you feel that you have a problem with memory? No  Do you often misplace items? No  Do you feel safe at home?  Yes  Cognitive Testing  Alert? Yes  Normal Appearance?Yes  Oriented to person? Yes  Place? Yes   Time? Yes  Recall of three objects?  Yes  Can perform simple calculations? Yes  Displays appropriate judgment?Yes  Can read the correct time from a watch face?Yes   Advanced Directives have been discussed with the patient? Yes   List the Names of Other Physician/Practitioners you currently use: 1.    Indicate any recent Medical Services you may have received from other than [facility] providers in the past year (date may be approximate).  Immunization History  Administered Date(s) Administered  . Influenza Split 09/20/2012, 09/24/2013  . Influenza-Unspecified 09/26/2014, 09/27/2015  . Pneumococcal Polysaccharide-23 09/20/2012    Screening Tests Health Maintenance  Topic Date Due  . PNA vac Low Risk Adult (2 of 2 - PCV13) 09/20/2013  . INFLUENZA VACCINE  07/27/2016  . COLONOSCOPY  12/27/2016  . TETANUS/TDAP  11/26/2019  . ZOSTAVAX  Addressed    All answers were reviewed with the patient and necessary  referrals were made:  Aundria Mems, MD   04/14/2016   History reviewed: allergies, current medications, past family history, past medical history, past social history, past surgical history and problem list  Review of Systems A comprehensive review of systems was negative.    Objective:     Vision by Snellen chart:  Please see documentation Blood pressure 142/76, pulse 80, resp. rate 18, height 6\' 1"  (1.854 m), weight 188 lb 6.4 oz (85.458 kg). Body mass index is 24.86 kg/(m^2). General: Well Developed, well nourished, and in no acute distress.  Neuro: Alert and oriented x3, extra-ocular muscles intact, sensation grossly intact. Cranial nerves II through XII are intact, motor, sensory, and coordinative functions are all intact. HEENT: Normocephalic, atraumatic, pupils equal round reactive to light, neck supple, no masses, no lymphadenopathy, thyroid nonpalpable. Oropharynx, nasopharynx, external ear canals are unremarkable. Skin: Warm and dry, no rashes noted.  Cardiac: Regular rate and rhythm, no murmurs rubs or gallops.  Respiratory: Clear to auscultation bilaterally. Not using accessory muscles, speaking in full sentences.  Abdominal: Soft, nontender, nondistended, positive bowel sounds, no masses, no organomegaly.  Musculoskeletal: Shoulder, elbow, wrist, hip, knee, ankle stable, and with full range of motion.   Assessment:      healthy male      Plan:     During the course of the visit the patient was educated and counseled about appropriate screening and preventive services including:    Pneumococcal vaccine   Screening electrocardiogram  Diet review for nutrition referral? Yes ____  Not Indicated __x__   Patient Instructions (the written plan) was given to the patient.  Medicare Attestation I have personally reviewed: The patient's medical and social history Their use of alcohol, tobacco or illicit drugs Their current medications and supplements The  patient's functional ability including ADLs,fall risks, home safety risks, cognitive, and hearing and visual impairment Diet and physical activities Evidence for depression or mood disorders  The patient's weight, height, BMI, and visual acuity have been recorded in the chart.  I have made  referrals, counseling, and provided education to the patient based on review of the above and I have provided the patient with a written personalized care plan for preventive services.   ___________________________________________ Gwen Her. Dianah Field, M.D., ABFM., CAQSM. Primary Care and [place] Instructor of [place] [facility] [employer]

## 2016-04-14 NOTE — Assessment & Plan Note (Signed)
Stable and well controlled. 

## 2016-04-14 NOTE — Assessment & Plan Note (Signed)
Rechecking BMP and urinalysis.

## 2016-04-15 DIAGNOSIS — R809 Proteinuria, unspecified: Secondary | ICD-10-CM | POA: Insufficient documentation

## 2016-04-15 LAB — URINALYSIS
Bilirubin Urine: NEGATIVE
Glucose, UA: NEGATIVE
Hgb urine dipstick: NEGATIVE
Ketones, ur: NEGATIVE
Leukocytes, UA: NEGATIVE
Nitrite: NEGATIVE
Specific Gravity, Urine: 1.014 (ref 1.001–1.035)
pH: 6.5 (ref 5.0–8.0)

## 2016-04-15 NOTE — Addendum Note (Signed)
Addended by: Huel Cote on: 04/15/2016 09:15 AM   Modules accepted: Orders

## 2016-04-15 NOTE — Assessment & Plan Note (Signed)
With increasing creatinine. Renal ultrasound, 24-hour urine protein sample, urine and serum protein electrophoresis.

## 2016-04-15 NOTE — Addendum Note (Signed)
Addended by: Silverio Decamp on: 04/15/2016 08:53 AM   Modules accepted: Orders

## 2016-04-16 ENCOUNTER — Encounter: Payer: Self-pay | Admitting: Sports Medicine

## 2016-04-20 ENCOUNTER — Ambulatory Visit (INDEPENDENT_AMBULATORY_CARE_PROVIDER_SITE_OTHER): Payer: Medicare Other

## 2016-04-20 DIAGNOSIS — R809 Proteinuria, unspecified: Secondary | ICD-10-CM

## 2016-04-20 DIAGNOSIS — N1330 Unspecified hydronephrosis: Secondary | ICD-10-CM

## 2016-04-21 ENCOUNTER — Encounter: Payer: Self-pay | Admitting: Sports Medicine

## 2016-04-29 ENCOUNTER — Encounter: Payer: Self-pay | Admitting: Sports Medicine

## 2016-04-29 NOTE — Telephone Encounter (Signed)
Steven Pearson I'm sure we have had this happen before, do you remember the code that needs to be used with routine bloodwork so that medicare doesn't deny it and would you please look into changing it?

## 2016-05-10 ENCOUNTER — Other Ambulatory Visit: Payer: Self-pay | Admitting: Sports Medicine

## 2016-05-10 DIAGNOSIS — R809 Proteinuria, unspecified: Secondary | ICD-10-CM

## 2016-05-10 LAB — BASIC METABOLIC PANEL
BUN: 24 mg/dL (ref 7–25)
CO2: 28 mmol/L (ref 20–31)
Calcium: 9.4 mg/dL (ref 8.6–10.3)
Chloride: 102 mmol/L (ref 98–110)
Creat: 1.68 mg/dL — ABNORMAL HIGH (ref 0.70–1.11)
Glucose, Bld: 88 mg/dL (ref 65–99)
Potassium: 4.3 mmol/L (ref 3.5–5.3)
Sodium: 140 mmol/L (ref 135–146)

## 2016-05-14 ENCOUNTER — Encounter: Payer: Self-pay | Admitting: Sports Medicine

## 2016-05-14 ENCOUNTER — Ambulatory Visit (INDEPENDENT_AMBULATORY_CARE_PROVIDER_SITE_OTHER): Payer: Medicare Other | Admitting: Sports Medicine

## 2016-05-14 VITALS — BP 150/70 | HR 58 | Resp 18 | Wt 191.0 lb

## 2016-05-14 DIAGNOSIS — N400 Enlarged prostate without lower urinary tract symptoms: Secondary | ICD-10-CM

## 2016-05-14 DIAGNOSIS — I10 Essential (primary) hypertension: Secondary | ICD-10-CM | POA: Diagnosis not present

## 2016-05-14 DIAGNOSIS — R600 Localized edema: Secondary | ICD-10-CM

## 2016-05-14 MED ORDER — TAMSULOSIN HCL 0.4 MG PO CAPS: 0.8000 mg | ORAL_CAPSULE | Freq: Every day | ORAL | Status: DC

## 2016-05-14 MED ORDER — AMBULATORY NON FORMULARY MEDICATION: Status: DC

## 2016-05-14 NOTE — Assessment & Plan Note (Signed)
Elevated, but off HCTZ.

## 2016-05-14 NOTE — Assessment & Plan Note (Signed)
Increase Flomax to 0.8 mg. Next line he noted a marginal improvement with going to the initial 0.4. We did discuss referral to urology for further evaluation of his hydronephrosis, and discussion of a more permanent procedure such as TURP for his prostate. We're going to hold off on this for 6 months.

## 2016-05-14 NOTE — Progress Notes (Signed)
  Subjective:    CC: Follow-up  HPI: Hypertension: Discontinued hydrochlorothiazide due to worsening renal insufficiency, it does seem to have stabilized. Blood pressure is however elevated.  Obstructive uropathy: With hydronephrosis, voiding a bit better with 0.4 mg of Flomax.  Past medical history, Surgical history, Family history not pertinant except as noted below, Social history, Allergies, and medications have been entered into the medical record, reviewed, and no changes needed.   Review of Systems: No fevers, chills, night sweats, weight loss, chest pain, or shortness of breath.   Objective:    General: Well Developed, well nourished, and in no acute distress.  Neuro: Alert and oriented x3, extra-ocular muscles intact, sensation grossly intact.  HEENT: Normocephalic, atraumatic, pupils equal round reactive to light, neck supple, no masses, no lymphadenopathy, thyroid nonpalpable.  Skin: Warm and dry, no rashes. Cardiac: Regular rate and rhythm, no murmurs rubs or gallops, no lower extremity edema.  Respiratory: Clear to auscultation bilaterally. Not using accessory muscles, speaking in full sentences.  Impression and Recommendations:    I spent 40 minutes with this patient, greater than 50% was face-to-face time counseling regarding the above diagnoses

## 2016-05-17 MED ORDER — AMLODIPINE BESYLATE 5 MG PO TABS: 5.0000 mg | ORAL_TABLET | Freq: Every day | ORAL | Status: DC

## 2016-05-17 NOTE — Addendum Note (Signed)
Addended by: Silverio Decamp on: 05/17/2016 05:07 PM   Modules accepted: Orders, Medications

## 2016-05-17 NOTE — Telephone Encounter (Signed)
DC amlodipine, restart HCTZ 12.5 BID.

## 2016-05-17 NOTE — Addendum Note (Signed)
Addended by: Silverio Decamp on: 05/17/2016 01:19 PM   Modules accepted: Orders

## 2016-05-18 ENCOUNTER — Telehealth: Payer: Self-pay

## 2016-05-18 NOTE — Telephone Encounter (Signed)
Pt left VM stating he would like to talk to you about medication sent in. States he didn't think it was time for an actual blood pressure medication at this time. Pt did state he would like to speak with you. Please assist.

## 2016-05-19 ENCOUNTER — Encounter: Payer: Self-pay | Admitting: Sports Medicine

## 2016-05-19 DIAGNOSIS — N400 Enlarged prostate without lower urinary tract symptoms: Secondary | ICD-10-CM

## 2016-05-19 MED ORDER — TAMSULOSIN HCL 0.4 MG PO CAPS: 0.8000 mg | ORAL_CAPSULE | Freq: Every day | ORAL | Status: DC

## 2016-07-11 ENCOUNTER — Encounter: Payer: Self-pay | Admitting: Sports Medicine

## 2016-07-12 ENCOUNTER — Other Ambulatory Visit: Payer: Self-pay

## 2016-07-12 MED ORDER — FEBUXOSTAT 40 MG PO TABS: 40.0000 mg | ORAL_TABLET | Freq: Every day | ORAL | Status: DC

## 2016-08-27 DIAGNOSIS — L814 Other melanin hyperpigmentation: Secondary | ICD-10-CM | POA: Diagnosis not present

## 2016-08-27 DIAGNOSIS — Z85820 Personal history of malignant melanoma of skin: Secondary | ICD-10-CM | POA: Diagnosis not present

## 2016-08-27 DIAGNOSIS — L570 Actinic keratosis: Secondary | ICD-10-CM | POA: Diagnosis not present

## 2016-09-24 DIAGNOSIS — Z23 Encounter for immunization: Secondary | ICD-10-CM | POA: Diagnosis not present

## 2016-10-12 ENCOUNTER — Other Ambulatory Visit: Payer: Self-pay

## 2016-10-12 DIAGNOSIS — E7849 Other hyperlipidemia: Secondary | ICD-10-CM

## 2016-10-12 MED ORDER — ATORVASTATIN CALCIUM 20 MG PO TABS: 20.0000 mg | ORAL_TABLET | Freq: Every day | ORAL | 2 refills | Status: DC

## 2016-11-04 ENCOUNTER — Other Ambulatory Visit: Payer: Self-pay

## 2016-11-04 MED ORDER — TADALAFIL 5 MG PO TABS: 5.0000 mg | ORAL_TABLET | Freq: Every day | ORAL | 1 refills | Status: DC

## 2016-12-07 ENCOUNTER — Encounter: Payer: Self-pay | Admitting: Sports Medicine

## 2016-12-07 MED ORDER — TERAZOSIN HCL 2 MG PO CAPS: 2.0000 mg | ORAL_CAPSULE | Freq: Every day | ORAL | 3 refills | Status: DC

## 2016-12-08 ENCOUNTER — Encounter: Payer: Self-pay | Admitting: Sports Medicine

## 2016-12-08 ENCOUNTER — Other Ambulatory Visit: Payer: Self-pay

## 2016-12-08 MED ORDER — TERAZOSIN HCL 2 MG PO CAPS: 2.0000 mg | ORAL_CAPSULE | Freq: Every day | ORAL | 3 refills | Status: DC

## 2016-12-08 MED ORDER — COLESTIPOL HCL 1 G PO TABS: 3.0000 g | ORAL_TABLET | Freq: Two times a day (BID) | ORAL | 3 refills | Status: DC

## 2016-12-08 MED ORDER — RANITIDINE HCL 150 MG PO TABS: 150.0000 mg | ORAL_TABLET | Freq: Two times a day (BID) | ORAL | 3 refills | Status: DC

## 2016-12-08 NOTE — Telephone Encounter (Signed)
Spoke with Pt, issues have been addressed.

## 2016-12-21 ENCOUNTER — Telehealth: Payer: Self-pay | Admitting: Sports Medicine

## 2016-12-21 NOTE — Telephone Encounter (Signed)
Received fax for prior auth on Cialis 5mg  filled out form and faxed it back now waiting on determination. - CF

## 2016-12-23 NOTE — Telephone Encounter (Signed)
Received fax from [place] is approved form 10/22/2016 - 12/21/2017. - CF

## 2017-01-17 ENCOUNTER — Encounter: Payer: Self-pay | Admitting: Sports Medicine

## 2017-04-20 ENCOUNTER — Other Ambulatory Visit: Payer: Self-pay

## 2017-04-20 DIAGNOSIS — N401 Enlarged prostate with lower urinary tract symptoms: Secondary | ICD-10-CM

## 2017-04-20 MED ORDER — HYDROCHLOROTHIAZIDE 12.5 MG PO TABS: 12.5000 mg | ORAL_TABLET | Freq: Two times a day (BID) | ORAL | 0 refills | Status: DC

## 2017-04-20 MED ORDER — TADALAFIL 5 MG PO TABS: 5.0000 mg | ORAL_TABLET | Freq: Every day | ORAL | 0 refills | Status: DC

## 2017-04-20 MED ORDER — FEBUXOSTAT 40 MG PO TABS: 40.0000 mg | ORAL_TABLET | Freq: Every day | ORAL | 0 refills | Status: DC

## 2017-07-11 ENCOUNTER — Other Ambulatory Visit: Payer: Self-pay

## 2017-07-11 DIAGNOSIS — E7849 Other hyperlipidemia: Secondary | ICD-10-CM

## 2017-07-11 MED ORDER — ATORVASTATIN CALCIUM 20 MG PO TABS: 20.0000 mg | ORAL_TABLET | Freq: Every day | ORAL | 0 refills | Status: DC

## 2017-07-18 ENCOUNTER — Other Ambulatory Visit: Payer: Self-pay

## 2017-07-18 DIAGNOSIS — E7849 Other hyperlipidemia: Secondary | ICD-10-CM

## 2017-07-18 MED ORDER — ATORVASTATIN CALCIUM 20 MG PO TABS: 20.0000 mg | ORAL_TABLET | Freq: Every day | ORAL | 0 refills | Status: DC

## 2017-07-19 ENCOUNTER — Encounter: Payer: Self-pay | Admitting: Sports Medicine

## 2017-07-19 DIAGNOSIS — Z0000 Encounter for general adult medical examination without abnormal findings: Secondary | ICD-10-CM

## 2017-07-20 LAB — CBC
HCT: 39.9 % (ref 38.5–50.0)
Hemoglobin: 13.2 g/dL (ref 13.2–17.1)
MCH: 32.4 pg (ref 27.0–33.0)
MCHC: 33.1 g/dL (ref 32.0–36.0)
MCV: 98 fL (ref 80.0–100.0)
MPV: 11.7 fL (ref 7.5–12.5)
Platelets: 155 10*3/uL (ref 140–400)
RBC: 4.07 MIL/uL — ABNORMAL LOW (ref 4.20–5.80)
RDW: 12.9 % (ref 11.0–15.0)
WBC: 5.7 10*3/uL (ref 3.8–10.8)

## 2017-07-20 LAB — COMPREHENSIVE METABOLIC PANEL
ALT: 9 U/L (ref 9–46)
AST: 13 U/L (ref 10–35)
Albumin: 4.1 g/dL (ref 3.6–5.1)
Alkaline Phosphatase: 44 U/L (ref 40–115)
BUN: 35 mg/dL — ABNORMAL HIGH (ref 7–25)
CO2: 24 mmol/L (ref 20–31)
Calcium: 9.2 mg/dL (ref 8.6–10.3)
Chloride: 102 mmol/L (ref 98–110)
Creat: 1.76 mg/dL — ABNORMAL HIGH (ref 0.70–1.11)
Glucose, Bld: 87 mg/dL (ref 65–99)
Potassium: 4.4 mmol/L (ref 3.5–5.3)
Sodium: 138 mmol/L (ref 135–146)
Total Bilirubin: 0.8 mg/dL (ref 0.2–1.2)
Total Protein: 6.5 g/dL (ref 6.1–8.1)

## 2017-07-20 LAB — LIPID PANEL W/REFLEX DIRECT LDL
Cholesterol: 121 mg/dL (ref <200)
HDL: 57 mg/dL (ref >40)
LDL-Cholesterol: 48 mg/dL
Non-HDL Cholesterol (Calc): 64 mg/dL (ref <130)
Total CHOL/HDL Ratio: 2.1 Ratio (ref <5.0)
Triglycerides: 81 mg/dL (ref <150)

## 2017-07-20 LAB — TSH: TSH: 2.4 mIU/L (ref 0.40–4.50)

## 2017-07-21 LAB — VITAMIN D 25 HYDROXY (VIT D DEFICIENCY, FRACTURES): Vit D, 25-Hydroxy: 39 ng/mL (ref 30–100)

## 2017-07-21 LAB — HEMOGLOBIN A1C
Hgb A1c MFr Bld: 5.2 % (ref <5.7)
Mean Plasma Glucose: 103 mg/dL

## 2017-07-21 LAB — PSA, TOTAL AND FREE
PSA, % Free: 29 % (ref >25)
PSA, Free: 2 ng/mL
PSA, Total: 6.8 ng/mL — ABNORMAL HIGH (ref <=4.0)

## 2017-07-21 LAB — URIC ACID: Uric Acid, Serum: 5.7 mg/dL (ref 4.0–8.0)

## 2017-07-21 NOTE — Assessment & Plan Note (Signed)
PSA has increased to 6.8, recheck it again in 3 months.

## 2017-07-22 ENCOUNTER — Other Ambulatory Visit: Payer: Self-pay

## 2017-07-22 ENCOUNTER — Encounter: Payer: Self-pay | Admitting: Sports Medicine

## 2017-07-22 DIAGNOSIS — E7849 Other hyperlipidemia: Secondary | ICD-10-CM

## 2017-07-22 MED ORDER — ATORVASTATIN CALCIUM 20 MG PO TABS: 20.0000 mg | ORAL_TABLET | Freq: Every day | ORAL | 1 refills | Status: DC

## 2017-07-22 NOTE — Telephone Encounter (Signed)
Rx sent Pt advised 

## 2017-07-25 ENCOUNTER — Other Ambulatory Visit: Payer: Self-pay

## 2017-09-20 ENCOUNTER — Other Ambulatory Visit: Payer: Self-pay

## 2017-09-20 MED ORDER — HYDROCHLOROTHIAZIDE 12.5 MG PO TABS: 12.5000 mg | ORAL_TABLET | Freq: Two times a day (BID) | ORAL | 0 refills | Status: DC

## 2017-09-20 MED ORDER — FEBUXOSTAT 40 MG PO TABS: 40.0000 mg | ORAL_TABLET | Freq: Every day | ORAL | 0 refills | Status: DC

## 2017-09-20 MED ORDER — TERAZOSIN HCL 2 MG PO CAPS: 2.0000 mg | ORAL_CAPSULE | Freq: Every day | ORAL | 0 refills | Status: DC

## 2017-09-21 ENCOUNTER — Telehealth: Payer: Self-pay | Admitting: Sports Medicine

## 2017-09-21 NOTE — Telephone Encounter (Addendum)
Patient states he requested through [facility] refills on Hytrin, HCTZ and Uloric. He needed 90 day supply and states he spoke to Dr Dianah Field about the need for the refills and that he would come in for an appointment in October. I could not find in the chart where he communicated this to Dr Dianah Field. He also is not scheduled for a follow up appointment in October. He states a 30 day supply was sent to [facility] and he wants it cancelled. I called and cancelled the prescriptions. It is protocol to send in a 30 day supply when a patient has not been seen in over a year. Please advise.     Last visit with Dr Dianah Field requested patient, Return in about 6 months (around 11/14/2016).    Patient would like a return call when this has been taken care of.

## 2017-09-21 NOTE — Telephone Encounter (Signed)
Pt has called and left messages for [place] to call him back and he has not got a phone call. Pts meds got sent to the wrong

## 2017-09-21 NOTE — Telephone Encounter (Signed)
OK to give him a 30 days supply of whatever he wants but no further because you are right, he needs an appt since its been over a year.  At least to check his BP and catch him up on health maintenance.

## 2017-09-22 NOTE — Telephone Encounter (Signed)
Left information on Pt's VM. Requested callback to see what Rx's he would like refilled and where he wants them sent.

## 2017-09-23 ENCOUNTER — Encounter: Payer: Self-pay | Admitting: Sports Medicine

## 2017-10-05 ENCOUNTER — Encounter: Payer: Self-pay | Admitting: Sports Medicine

## 2017-10-05 ENCOUNTER — Ambulatory Visit (INDEPENDENT_AMBULATORY_CARE_PROVIDER_SITE_OTHER): Payer: Medicare Other | Admitting: Sports Medicine

## 2017-10-05 VITALS — BP 138/70 | HR 88 | Resp 16 | Ht 72.0 in | Wt 194.0 lb

## 2017-10-05 DIAGNOSIS — Z0000 Encounter for general adult medical examination without abnormal findings: Secondary | ICD-10-CM | POA: Diagnosis not present

## 2017-10-05 DIAGNOSIS — Z23 Encounter for immunization: Secondary | ICD-10-CM

## 2017-10-05 DIAGNOSIS — H6123 Impacted cerumen, bilateral: Secondary | ICD-10-CM

## 2017-10-05 MED ORDER — TERAZOSIN HCL 2 MG PO CAPS: 2.0000 mg | ORAL_CAPSULE | Freq: Every day | ORAL | 3 refills | Status: DC

## 2017-10-05 MED ORDER — HYDROCHLOROTHIAZIDE 12.5 MG PO TABS: 12.5000 mg | ORAL_TABLET | Freq: Two times a day (BID) | ORAL | 3 refills | Status: DC

## 2017-10-05 MED ORDER — FEBUXOSTAT 40 MG PO TABS: 40.0000 mg | ORAL_TABLET | Freq: Every day | ORAL | 3 refills | Status: DC

## 2017-10-05 NOTE — Progress Notes (Signed)
Subjective:   Steven Pearson is a 83 y.o. male who presents for Medicare Annual/Subsequent preventive examination.  He has a few complaints, see assessment and plan for further details   Review of Systems:  Negative except as noted above in the history of present illness.        Objective:    Vitals: There were no vitals taken for this visit.  There is no height or weight on file to calculate BMI.  Tobacco History  Smoking Status  . Former Smoker  . Packs/day: 1.50  . Years: 45.00  . Types: Cigarettes  . Quit date: 08/17/1999  Smokeless Tobacco  . Never Used     Counseling given: Not Answered   Past Medical History:  Diagnosis Date  . BPH (benign prostatic hypertrophy)   . GERD (gastroesophageal reflux disease)   . Hyperlipidemia   . Hypertension   . Normal cardiac stress test 11/28/13   Treadmill  . PVC (premature ventricular contraction)    Past Surgical History:  Procedure Laterality Date  . CATARACT EXTRACTION, BILATERAL    . HAND SURGERY     right  . TONSILLECTOMY AND ADENOIDECTOMY     Family History  Problem Relation Age of Onset  . Heart disease Father        Pacemaker   History  Sexual Activity  . Sexual activity: Not Currently    Outpatient Encounter Prescriptions as of 10/05/2017  Medication Sig  . AMBULATORY NON FORMULARY MEDICATION Knee-high, medium compression, graduated compression stockings. Apply to lower extremities.  Marland Kitchen atorvastatin (LIPITOR) 20 MG tablet Take 1 tablet (20 mg total) by mouth daily.  . beta carotene w/minerals (OCUVITE) tablet Take 1 tablet by mouth daily.  . Cholecalciferol (VITAMIN D-3) 5000 UNITS TABS Take 1 tablet by mouth every other day.  . colestipol (COLESTID) 1 g tablet Take 3 tablets (3 g total) by mouth 2 (two) times daily.  . Cyanocobalamin (B-12 PO) Take 1 tablet by mouth daily.  . febuxostat (ULORIC) 40 MG tablet Take 1 tablet (40 mg total) by mouth daily. APPOINTMENT NEEDED FOR FUTURE REFILLS  .  FOLIC ACID PO Take 1 tablet by mouth daily.  . hydrochlorothiazide (HYDRODIURIL) 12.5 MG tablet Take 1 tablet (12.5 mg total) by mouth 2 (two) times daily. APPOINTMENT NEEDED FOR FUTURE REFILLS  . meloxicam (MOBIC) 15 MG tablet One tab PO qAM with breakfast for 2 weeks, then daily prn pain.  . Multiple Vitamin (MULTIVITAMIN WITH MINERALS) TABS tablet Take 1 tablet by mouth daily.  . NON FORMULARY Kefir 3 oz daily  . omeprazole (PRILOSEC) 10 MG capsule Take 1 capsule (10 mg total) by mouth daily.  . Probiotic Product (PROBIOTIC DAILY PO) Take 1 tablet by mouth daily. Kefir  . ranitidine (ZANTAC) 150 MG tablet Take 1 tablet (150 mg total) by mouth 2 (two) times daily.  . tadalafil (CIALIS) 5 MG tablet Take 1 tablet (5 mg total) by mouth daily. APPOINTMENT NEEDED FOR FUTURE REFILLS  . terazosin (HYTRIN) 2 MG capsule Take 1 capsule (2 mg total) by mouth at bedtime. APPOINTMENT NEEDED FOR FUTURE REFILLS  . Ubiquinol 100 MG CAPS Take 1 tablet by mouth daily.  Marland Kitchen VITAMIN E PO Take 1 tablet by mouth daily.  Marland Kitchen VITAMIN K PO Take 1 tablet by mouth daily.   No facility-administered encounter medications on file as of 10/05/2017.    General: Well Developed, well nourished, and in no acute distress.  Neuro: Alert and oriented x3, extra-ocular muscles intact,  sensation grossly intact. Cranial nerves II through XII are intact, motor, sensory, and coordinative functions are all intact. HEENT: Normocephalic, atraumatic, pupils equal round reactive to light, neck supple, no masses, no lymphadenopathy, thyroid nonpalpable. Oropharynx, nasopharynx, unremarkable, bilateral cerumen impactions Skin: Warm and dry, no rashes noted.  Cardiac: Regular rate and rhythm, no murmurs rubs or gallops.  Respiratory: Clear to auscultation bilaterally. Not using accessory muscles, speaking in full sentences.  Abdominal: Soft, nontender, nondistended, positive bowel sounds, no masses, no organomegaly.  Musculoskeletal: Shoulder,  elbow, wrist, hip, knee, ankle stable, and with full range of motion.  Indication: Cerumen impaction of the left and right ear(s) Medical necessity statement: On physical examination, cerumen impairs clinically significant portions of the external auditory canal, and tympanic membrane. Noted obstructive, copious cerumen that cannot be removed without magnification and instrumentations requiring physician skills Consent: Discussed benefits and risks of procedure and verbal consent obtained Procedure: Patient was prepped for the procedure. Utilized an otoscope to assess and take note of the ear canal, the tympanic membrane, and the presence, amount, and placement of the cerumen. Soft plastic curette was utilized to remove cerumen.  Post procedure examination: shows cerumen was completely removed. Patient tolerated procedure well. The patient is made aware that they may experience temporary vertigo, temporary hearing loss, and temporary discomfort. If these symptom last for more than 24 hours to call the clinic or proceed to the ED.  Activities of Daily Living No flowsheet data found.  Patient Care Team: Silverio Decamp, MD as PCP - General (Family Medicine)   Assessment:    Healthy male, see below for further details  Exercise Activities and Dietary recommendations    Goals    None     Fall Risk Fall Risk  04/14/2016 05/14/2014  Falls in the past year? Yes No   Depression Screen PHQ 2/9 Scores 04/14/2016 05/14/2014  PHQ - 2 Score 0 0    Cognitive Function        Immunization History  Administered Date(s) Administered  . Influenza Split 09/20/2012, 09/24/2013  . Influenza-Unspecified 09/26/2014, 09/27/2015  . Pneumococcal Polysaccharide-23 09/20/2012   Screening Tests Health Maintenance  Topic Date Due  . PNA vac Low Risk Adult (2 of 2 - PCV13) 09/20/2013  . INFLUENZA VACCINE  07/27/2017  . TETANUS/TDAP  11/26/2019      Plan:   See below  I have personally  reviewed and noted the following in the patient's chart:   . Medical and social history . Use of alcohol, tobacco or illicit drugs  . Current medications and supplements . Functional ability and status . Nutritional status . Physical activity . Advanced directives . List of other physicians . Hospitalizations, surgeries, and ER visits in previous 12 months . Vitals . Screenings to include cognitive, depression, and falls . Referrals and appointments  In addition, I have reviewed and discussed with patient certain preventive protocols, quality metrics, and best practice recommendations. A written personalized care plan for preventive services as well as general preventive health recommendations were provided to patient.     Aundria Mems, MD  10/05/2017

## 2017-10-05 NOTE — Assessment & Plan Note (Signed)
Medicare wellness exam. High-dose flu vaccine. He has had pneumonia 23, I don't see in the records that he's had pneumococcal 63, we will administer this today as well. Otherwise refilling medications, we did discuss checking testosterone, I be happy to take him up to 800 or 900 to help put on lean muscle, he is going to think about it.

## 2017-10-10 ENCOUNTER — Ambulatory Visit: Payer: Medicare Other | Admitting: Physician Assistant

## 2017-10-12 ENCOUNTER — Other Ambulatory Visit: Payer: Self-pay | Admitting: Sports Medicine

## 2017-10-12 ENCOUNTER — Encounter: Payer: Self-pay | Admitting: Sports Medicine

## 2017-10-27 DIAGNOSIS — Z1211 Encounter for screening for malignant neoplasm of colon: Secondary | ICD-10-CM | POA: Diagnosis not present

## 2017-10-27 DIAGNOSIS — Z1212 Encounter for screening for malignant neoplasm of rectum: Secondary | ICD-10-CM | POA: Diagnosis not present

## 2017-10-27 MED ORDER — TADALAFIL 5 MG PO TABS: 5.0000 mg | ORAL_TABLET | Freq: Every day | ORAL | 3 refills | Status: DC

## 2017-11-04 ENCOUNTER — Encounter: Payer: Self-pay | Admitting: Sports Medicine

## 2017-11-04 LAB — COLOGUARD: Cologuard: NEGATIVE

## 2017-11-06 ENCOUNTER — Encounter: Payer: Self-pay | Admitting: Sports Medicine

## 2017-11-10 ENCOUNTER — Encounter: Payer: Self-pay | Admitting: Sports Medicine

## 2017-11-14 ENCOUNTER — Telehealth: Payer: Self-pay

## 2017-11-14 ENCOUNTER — Encounter: Payer: Self-pay | Admitting: Sports Medicine

## 2017-11-14 ENCOUNTER — Ambulatory Visit (INDEPENDENT_AMBULATORY_CARE_PROVIDER_SITE_OTHER): Payer: Medicare Other | Admitting: Sports Medicine

## 2017-11-14 DIAGNOSIS — K5730 Diverticulosis of large intestine without perforation or abscess without bleeding: Secondary | ICD-10-CM | POA: Diagnosis not present

## 2017-11-14 DIAGNOSIS — K5790 Diverticulosis of intestine, part unspecified, without perforation or abscess without bleeding: Secondary | ICD-10-CM | POA: Insufficient documentation

## 2017-11-14 MED ORDER — CIPROFLOXACIN HCL 750 MG PO TABS: 750.0000 mg | ORAL_TABLET | Freq: Two times a day (BID) | ORAL | 0 refills | Status: AC

## 2017-11-14 MED ORDER — METRONIDAZOLE 500 MG PO TABS: 500.0000 mg | ORAL_TABLET | Freq: Two times a day (BID) | ORAL | 0 refills | Status: AC

## 2017-11-14 NOTE — Progress Notes (Signed)
  Subjective:    CC: Abdominal pain  HPI: This is a pleasant 83 year old male with a history of diverticulosis, a week ago he started to have some vague left lower quadrant abdominal pain without diarrhea, hematochezia, melena, vomiting or nausea.  The pain was a 7/10.  Ultimately he watched it and it improved considerably, down to a 4 or 5 now.  It is worse with movement, going from sitting to standing, not worse with straining.  No dysuria, no flank pain, no perineal pain.  No constitutional symptoms, no other associated symptoms.  Past medical history:  Negative.  See flowsheet/record as well for more information.  Surgical history: Negative.  See flowsheet/record as well for more information.  Family history: Negative.  See flowsheet/record as well for more information.  Social history: Negative.  See flowsheet/record as well for more information.  Allergies, and medications have been entered into the medical record, reviewed, and no changes needed.   Review of Systems: No fevers, chills, night sweats, weight loss, chest pain, or shortness of breath.   Objective:    General: Well Developed, well nourished, and in no acute distress.  Neuro: Alert and oriented x3, extra-ocular muscles intact, sensation grossly intact.  HEENT: Normocephalic, atraumatic, pupils equal round reactive to light, neck supple, no masses, no lymphadenopathy, thyroid nonpalpable.  Skin: Warm and dry, no rashes. Cardiac: Regular rate and rhythm, no murmurs rubs or gallops, no lower extremity edema.  Respiratory: Clear to auscultation bilaterally. Not using accessory muscles, speaking in full sentences. Abdomen: Soft, nontender, nondistended, normal bowel sounds, no palpable masses, no guarding, rigidity, rebound pain.  No costovertebral angle tenderness. Left hip: ROM IR: 60 Deg, ER: 60 Deg, Flexion: 120 Deg, Extension: 100 Deg, Abduction: 45 Deg, Adduction: 45 Deg Strength IR: 5/5, ER: 5/5, Flexion: 5/5,  Extension: 5/5, Abduction: 5/5, Adduction: 5/5 Pelvic alignment unremarkable to inspection and palpation. Standing hip rotation and gait without trendelenburg / unsteadiness. Greater trochanter without tenderness to palpation. No tenderness over piriformis. No SI joint tenderness and normal minimal SI movement.  Impression and Recommendations:    Diverticulosis Suspect low-grade diverticulitis, adding Cipro, Flagyl for 7 days. Some of the pain was positional, adding some hip rehab exercises, he will do over-the-counter naproxen, if still having pain in 2 weeks I can see him back and we will do a CT of the abdomen and pelvis with contrast looking for diverticulitis, hernia.  ___________________________________________ Gwen Her. Dianah Field, M.D., ABFM., CAQSM. Primary Care and [place] Instructor of [place] [facility]

## 2017-11-14 NOTE — Telephone Encounter (Signed)
Pharmacy advised  

## 2017-11-14 NOTE — Telephone Encounter (Signed)
Pharmacy called and states the Cipro was sent for 7 days and the sig is for 10. Please advise.

## 2017-11-14 NOTE — Telephone Encounter (Signed)
Yes sorry, it needs to be 7 days.

## 2017-11-14 NOTE — Assessment & Plan Note (Signed)
Suspect low-grade diverticulitis, adding Cipro, Flagyl for 7 days. Some of the pain was positional, adding some hip rehab exercises, he will do over-the-counter naproxen, if still having pain in 2 weeks I can see him back and we will do a CT of the abdomen and pelvis with contrast looking for diverticulitis, hernia.

## 2017-11-15 ENCOUNTER — Ambulatory Visit: Payer: Self-pay | Admitting: Sports Medicine

## 2017-11-19 ENCOUNTER — Encounter: Payer: Self-pay | Admitting: Sports Medicine

## 2017-11-21 MED ORDER — OMEPRAZOLE 10 MG PO CPDR: 10.0000 mg | DELAYED_RELEASE_CAPSULE | Freq: Every day | ORAL | 3 refills | Status: DC

## 2017-11-28 ENCOUNTER — Other Ambulatory Visit: Payer: Self-pay

## 2017-11-28 DIAGNOSIS — E7849 Other hyperlipidemia: Secondary | ICD-10-CM

## 2017-11-28 MED ORDER — ATORVASTATIN CALCIUM 20 MG PO TABS: 20.0000 mg | ORAL_TABLET | Freq: Every day | ORAL | 1 refills | Status: DC

## 2017-12-05 ENCOUNTER — Encounter: Payer: Self-pay | Admitting: Sports Medicine

## 2017-12-13 ENCOUNTER — Encounter: Payer: Self-pay | Admitting: Sports Medicine

## 2017-12-13 ENCOUNTER — Ambulatory Visit (INDEPENDENT_AMBULATORY_CARE_PROVIDER_SITE_OTHER): Payer: Medicare Other | Admitting: Sports Medicine

## 2017-12-13 ENCOUNTER — Encounter (HOSPITAL_BASED_OUTPATIENT_CLINIC_OR_DEPARTMENT_OTHER): Payer: Self-pay

## 2017-12-13 ENCOUNTER — Ambulatory Visit (HOSPITAL_BASED_OUTPATIENT_CLINIC_OR_DEPARTMENT_OTHER)
Admission: RE | Admit: 2017-12-13 | Discharge: 2017-12-13 | Disposition: A | Discharge status: Home / Self Care | Payer: Medicare Other | Source: Ambulatory Visit | Attending: Sports Medicine | Admitting: Sports Medicine

## 2017-12-13 DIAGNOSIS — N1330 Unspecified hydronephrosis: Secondary | ICD-10-CM | POA: Insufficient documentation

## 2017-12-13 DIAGNOSIS — J986 Disorders of diaphragm: Secondary | ICD-10-CM | POA: Insufficient documentation

## 2017-12-13 DIAGNOSIS — I700 Atherosclerosis of aorta: Secondary | ICD-10-CM | POA: Insufficient documentation

## 2017-12-13 DIAGNOSIS — R1032 Left lower quadrant pain: Secondary | ICD-10-CM

## 2017-12-13 DIAGNOSIS — K5730 Diverticulosis of large intestine without perforation or abscess without bleeding: Secondary | ICD-10-CM

## 2017-12-13 DIAGNOSIS — I2510 Atherosclerotic heart disease of native coronary artery without angina pectoris: Secondary | ICD-10-CM | POA: Insufficient documentation

## 2017-12-13 DIAGNOSIS — N400 Enlarged prostate without lower urinary tract symptoms: Secondary | ICD-10-CM | POA: Insufficient documentation

## 2017-12-13 DIAGNOSIS — N320 Bladder-neck obstruction: Secondary | ICD-10-CM | POA: Diagnosis not present

## 2017-12-13 LAB — COMPREHENSIVE METABOLIC PANEL
AG Ratio: 1.6 (calc) (ref 1.0–2.5)
ALT: 12 U/L (ref 9–46)
AST: 19 U/L (ref 10–35)
Albumin: 3.9 g/dL (ref 3.6–5.1)
Alkaline phosphatase (APISO): 45 U/L (ref 40–115)
BUN/Creatinine Ratio: 16 (calc) (ref 6–22)
BUN: 26 mg/dL — ABNORMAL HIGH (ref 7–25)
CO2: 29 mmol/L (ref 20–32)
Calcium: 8.8 mg/dL (ref 8.6–10.3)
Chloride: 104 mmol/L (ref 98–110)
Creat: 1.65 mg/dL — ABNORMAL HIGH (ref 0.70–1.11)
Globulin: 2.4 g/dL (calc) (ref 1.9–3.7)
Glucose, Bld: 104 mg/dL — ABNORMAL HIGH (ref 65–99)
Potassium: 4.3 mmol/L (ref 3.5–5.3)
Sodium: 140 mmol/L (ref 135–146)
Total Bilirubin: 0.9 mg/dL (ref 0.2–1.2)
Total Protein: 6.3 g/dL (ref 6.1–8.1)

## 2017-12-13 LAB — CBC WITH DIFFERENTIAL/PLATELET
Basophils Absolute: 33 cells/uL (ref 0–200)
Basophils Relative: 0.5 %
Eosinophils Absolute: 191 cells/uL (ref 15–500)
Eosinophils Relative: 2.9 %
HCT: 38.1 % — ABNORMAL LOW (ref 38.5–50.0)
Hemoglobin: 12.9 g/dL — ABNORMAL LOW (ref 13.2–17.1)
Lymphs Abs: 1967 cells/uL (ref 850–3900)
MCH: 32.6 pg (ref 27.0–33.0)
MCHC: 33.9 g/dL (ref 32.0–36.0)
MCV: 96.2 fL (ref 80.0–100.0)
MPV: 11.6 fL (ref 7.5–12.5)
Monocytes Relative: 8.8 %
Neutro Abs: 3828 cells/uL (ref 1500–7800)
Neutrophils Relative %: 58 %
Platelets: 152 10*3/uL (ref 140–400)
RBC: 3.96 10*6/uL — ABNORMAL LOW (ref 4.20–5.80)
RDW: 11.4 % (ref 11.0–15.0)
Total Lymphocyte: 29.8 %
WBC mixed population: 581 cells/uL (ref 200–950)
WBC: 6.6 10*3/uL (ref 3.8–10.8)

## 2017-12-13 MED ORDER — IOPAMIDOL (ISOVUE-300) INJECTION 61%
100.0000 mL | Freq: Once | INTRAVENOUS | Status: AC | PRN
  Administered 2017-12-13: 80 mL via INTRAVENOUS

## 2017-12-13 NOTE — Assessment & Plan Note (Signed)
Severe hydronephrosis on the left. Patient will restart doxazosin, I like to see him back on Thursday in the morning, if bladder scan still reveals significant post void residual I will catheterize him. I would like urology to weigh in as well.

## 2017-12-13 NOTE — Addendum Note (Signed)
Addended by: Silverio Decamp on: 12/13/2017 04:59 PM   Modules accepted: Orders

## 2017-12-13 NOTE — Progress Notes (Addendum)
  Subjective:    CC: Abdominal pain  HPI: This is a pleasant 83 year old male, he comes in with a greater than 1 month history of left lower quadrant abdominal pain, he does have a clinical history of diverticulosis.  We treated him with Cipro and Flagyl initially, he tells me he may have improved to some degree but now has a recurrence of pain in the left lower quadrant, no nausea, vomiting, no diarrhea, no hematochezia, no melena.  No constitutional symptoms, simply persistent left lower quadrant abdominal pain.  Past medical history:  Negative.  See flowsheet/record as well for more information.  Surgical history: Negative.  See flowsheet/record as well for more information.  Family history: Negative.  See flowsheet/record as well for more information.  Social history: Negative.  See flowsheet/record as well for more information.  Allergies, and medications have been entered into the medical record, reviewed, and no changes needed.   (To billers/coders, pertinent past medical, social, surgical, family history can be found in problem list, if problem list is marked as reviewed then this indicates that past medical, social, surgical, family history was also reviewed)  Review of Systems: No fevers, chills, night sweats, weight loss, chest pain, or shortness of breath.   Objective:    General: Well Developed, well nourished, and in no acute distress.  Neuro: Alert and oriented x3, extra-ocular muscles intact, sensation grossly intact.  HEENT: Normocephalic, atraumatic, pupils equal round reactive to light, neck supple, no masses, no lymphadenopathy, thyroid nonpalpable.  Skin: Warm and dry, no rashes. Cardiac: Regular rate and rhythm, no murmurs rubs or gallops, no lower extremity edema.  Respiratory: Clear to auscultation bilaterally. Not using accessory muscles, speaking in full sentences. Abdomen: Soft, only minimally tender in the left lower quadrant, nondistended, normal bowel sounds, no  palpable masses, no guarding, rigidity, rebound tenderness.  Impression and Recommendations:    Diverticulosis Persistent pain with Cipro and Flagyl. At this point we are going to proceed with CT with oral and IV contrast more for failure of medical therapy. Stat CMP, CBC. Current GFR is 35. Also referral to gastroenterology.  Hydronephrosis of left kidney Severe hydronephrosis on the left. Patient will restart doxazosin, I like to see him back on Thursday in the morning, if bladder scan still reveals significant post void residual I will catheterize him. I would like urology to weigh in as well.  ___________________________________________ Gwen Her. Dianah Field, M.D., ABFM., CAQSM. Primary Care and [place] Instructor of [place] [facility]

## 2017-12-13 NOTE — Assessment & Plan Note (Addendum)
Persistent pain with Cipro and Flagyl. At this point we are going to proceed with CT with oral and IV contrast more for failure of medical therapy. Stat CMP, CBC. Current GFR is 35. Also referral to gastroenterology.

## 2017-12-15 ENCOUNTER — Encounter: Payer: Self-pay | Admitting: Sports Medicine

## 2017-12-15 ENCOUNTER — Ambulatory Visit (INDEPENDENT_AMBULATORY_CARE_PROVIDER_SITE_OTHER): Payer: Medicare Other | Admitting: Sports Medicine

## 2017-12-15 DIAGNOSIS — N1330 Unspecified hydronephrosis: Secondary | ICD-10-CM | POA: Diagnosis not present

## 2017-12-15 NOTE — Assessment & Plan Note (Addendum)
Improved voiding with doxazosin. I do need to get him into urology. Postvoid residual was still very high and he is developing renal insufficiency.

## 2017-12-15 NOTE — Progress Notes (Signed)
  Subjective:    CC: Hydronephrosis  HPI: Steven Pearson returns, he started taking his doxazosin and has noted increased voiding.  His CT did show severe hydronephrosis on the left with severe bladder enlargement and a severely enlarged prostate.  He has not seen a urologist in years, he does have some mild renal insufficiency.  Past medical history:  Negative.  See flowsheet/record as well for more information.  Surgical history: Negative.  See flowsheet/record as well for more information.  Family history: Negative.  See flowsheet/record as well for more information.  Social history: Negative.  See flowsheet/record as well for more information.  Allergies, and medications have been entered into the medical record, reviewed, and no changes needed.   (To billers/coders, pertinent past medical, social, surgical, family history can be found in problem list, if problem list is marked as reviewed then this indicates that past medical, social, surgical, family history was also reviewed)  Review of Systems: No fevers, chills, night sweats, weight loss, chest pain, or shortness of breath.   Objective:    General: Well Developed, well nourished, and in no acute distress.  Neuro: Alert and oriented x3, extra-ocular muscles intact, sensation grossly intact.  HEENT: Normocephalic, atraumatic, pupils equal round reactive to light, neck supple, no masses, no lymphadenopathy, thyroid nonpalpable.  Skin: Warm and dry, no rashes. Cardiac: Regular rate and rhythm, no murmurs rubs or gallops, no lower extremity edema.  Respiratory: Clear to auscultation bilaterally. Not using accessory muscles, speaking in full sentences.  Unofficial ultrasound performed and still shows a very large postvoid residual in the bladder.  Impression and Recommendations:    Hydronephrosis of left kidney Improved voiding with doxazosin. I do need to get him into urology. Postvoid residual was still very high and he is developing  renal insufficiency.  I spent 25 minutes with this patient, greater than 50% was face-to-face time counseling regarding the above diagnoses ___________________________________________ Gwen Her. Dianah Field, M.D., ABFM., CAQSM. Primary Care and [place] Instructor of [place] [facility]

## 2017-12-16 DIAGNOSIS — N130 Hydronephrosis with ureteropelvic junction obstruction: Secondary | ICD-10-CM | POA: Diagnosis not present

## 2017-12-16 DIAGNOSIS — R338 Other retention of urine: Secondary | ICD-10-CM | POA: Diagnosis not present

## 2017-12-16 DIAGNOSIS — N401 Enlarged prostate with lower urinary tract symptoms: Secondary | ICD-10-CM | POA: Diagnosis not present

## 2018-01-09 ENCOUNTER — Telehealth: Payer: Self-pay | Admitting: *Deleted

## 2018-01-09 NOTE — Telephone Encounter (Signed)
Form faxed to insurance company for cialis 5 gm

## 2018-01-12 NOTE — Telephone Encounter (Signed)
Approved from 12/11/17 through 01/10/2019. A letter will be mailed to patient per insurance. Notified pharmacy

## 2018-01-17 DIAGNOSIS — R338 Other retention of urine: Secondary | ICD-10-CM | POA: Diagnosis not present

## 2018-01-27 DIAGNOSIS — N401 Enlarged prostate with lower urinary tract symptoms: Secondary | ICD-10-CM | POA: Diagnosis not present

## 2018-01-27 DIAGNOSIS — N130 Hydronephrosis with ureteropelvic junction obstruction: Secondary | ICD-10-CM | POA: Diagnosis not present

## 2018-01-27 DIAGNOSIS — R338 Other retention of urine: Secondary | ICD-10-CM | POA: Diagnosis not present

## 2018-02-02 ENCOUNTER — Other Ambulatory Visit: Payer: Self-pay | Admitting: Urology

## 2018-02-07 ENCOUNTER — Encounter (HOSPITAL_COMMUNITY): Payer: Self-pay | Admitting: *Deleted

## 2018-02-21 DIAGNOSIS — I10 Essential (primary) hypertension: Secondary | ICD-10-CM | POA: Diagnosis not present

## 2018-02-21 DIAGNOSIS — N1330 Unspecified hydronephrosis: Secondary | ICD-10-CM | POA: Diagnosis not present

## 2018-02-21 DIAGNOSIS — R3914 Feeling of incomplete bladder emptying: Secondary | ICD-10-CM | POA: Diagnosis not present

## 2018-02-21 DIAGNOSIS — I2510 Atherosclerotic heart disease of native coronary artery without angina pectoris: Secondary | ICD-10-CM | POA: Diagnosis not present

## 2018-02-21 DIAGNOSIS — M1A9XX0 Chronic gout, unspecified, without tophus (tophi): Secondary | ICD-10-CM | POA: Diagnosis not present

## 2018-02-21 DIAGNOSIS — N401 Enlarged prostate with lower urinary tract symptoms: Secondary | ICD-10-CM | POA: Diagnosis not present

## 2018-02-21 DIAGNOSIS — K219 Gastro-esophageal reflux disease without esophagitis: Secondary | ICD-10-CM | POA: Diagnosis not present

## 2018-02-21 DIAGNOSIS — E782 Mixed hyperlipidemia: Secondary | ICD-10-CM | POA: Diagnosis not present

## 2018-02-21 DIAGNOSIS — R600 Localized edema: Secondary | ICD-10-CM | POA: Diagnosis not present

## 2018-02-27 NOTE — Patient Instructions (Addendum)
Steven Pearson  02/27/2018   Your procedure is scheduled on: Monday 03-06-18  Report to [facility] Main  Entrance  Report to admitting at 100 PM   Call this number if you have problems the morning of surgery [phone]   Remember: Do not eat food :After Midnight. CLEAR LIQUIDS FROM MIDNIGHT UNTIL 900 AM, THEN NOTHING BY MOUTH AFTER 900 AM DAY OF SURGERY.     CLEAR LIQUID DIET   Foods Allowed                                                                     Foods Excluded  Coffee and tea, regular and decaf                             liquids that you cannot  Plain Jell-O in any flavor                                             see through such as: Fruit ices (not with fruit pulp)                                     milk, soups, orange juice  Iced Popsicles                                    All solid food Carbonated beverages, regular and diet                                    Cranberry, grape and apple juices Sports drinks like Gatorade Lightly seasoned clear broth or consume(fat free) Sugar, honey syrup  Sample Menu Breakfast                                Lunch                                     Supper Cranberry juice                    Beef broth                            Chicken broth Jell-O                                     Grape juice                           Apple juice Coffee or tea  Jell-O                                      Popsicle                                                Coffee or tea                        Coffee or tea  _____________________________________________________________________     Take these medicines the morning of surgery with A SIP OF WATER: ATORVASTATIN (LIPITOR), FEBUXOSTAT (ULOSTAT), CIALIS, TERAZOSIN, OMEPRAZOLE                               You may not have any metal on your body including hair pins and              piercings  Do not wear jewelry, make-up, lotions, powders or  perfumes, deodorant             Do not wear nail polish.  Do not shave  48 hours prior to surgery.              Men may shave face and neck.   Do not bring valuables to the hospital. [place].  Contacts, dentures or bridgework may not be worn into surgery.  Leave suitcase in the car. After surgery it may be brought to your room.                  Please read over the following fact sheets you were given: _____________________________________________________________________             [facility] - Preparing for Surgery Before surgery, you can play an important role.  Because skin is not sterile, your skin needs to be as free of germs as possible.  You can reduce the number of germs on your skin by washing with CHG (chlorahexidine gluconate) soap before surgery.  CHG is an antiseptic cleaner which kills germs and bonds with the skin to continue killing germs even after washing. Please DO NOT use if you have an allergy to CHG or antibacterial soaps.  If your skin becomes reddened/irritated stop using the CHG and inform your nurse when you arrive at Short Stay. Do not shave (including legs and underarms) for at least 48 hours prior to the first CHG shower.  You may shave your face/neck. Please follow these instructions carefully:  1.  Shower with CHG Soap the night before surgery and the  morning of Surgery.  2.  If you choose to wash your hair, wash your hair first as usual with your  normal  shampoo.  3.  After you shampoo, rinse your hair and body thoroughly to remove the  shampoo.                           4.  Use CHG as you would any other liquid soap.  You can apply chg directly  to the skin and wash  Gently with a scrungie or clean washcloth.  5.  Apply the CHG Soap to your body ONLY FROM THE NECK DOWN.   Do not use on face/ open                           Wound or open sores. Avoid contact with eyes, ears mouth and  genitals (private parts).                       Wash face,  Genitals (private parts) with your normal soap.             6.  Wash thoroughly, paying special attention to the area where your surgery  will be performed.  7.  Thoroughly rinse your body with warm water from the neck down.  8.  DO NOT shower/wash with your normal soap after using and rinsing off  the CHG Soap.                9.  Pat yourself dry with a clean towel.            10.  Wear clean pajamas.            11.  Place clean sheets on your bed the night of your first shower and do not  sleep with pets. Day of Surgery : Do not apply any lotions/deodorants the morning of surgery.  Please wear clean clothes to the hospital/surgery center.  FAILURE TO FOLLOW THESE INSTRUCTIONS MAY RESULT IN THE CANCELLATION OF YOUR SURGERY PATIENT SIGNATURE_________________________________  NURSE SIGNATURE__________________________________  ________________________________________________________________________

## 2018-03-01 ENCOUNTER — Encounter (HOSPITAL_COMMUNITY)
Admission: RE | Admit: 2018-03-01 | Discharge: 2018-03-01 | Disposition: A | Discharge status: Home / Self Care | Payer: Medicare Other | Source: Ambulatory Visit | Attending: Urology | Admitting: Urology

## 2018-03-01 ENCOUNTER — Encounter (HOSPITAL_COMMUNITY): Payer: Self-pay

## 2018-03-01 ENCOUNTER — Other Ambulatory Visit: Payer: Self-pay

## 2018-03-01 DIAGNOSIS — Z01810 Encounter for preprocedural cardiovascular examination: Secondary | ICD-10-CM | POA: Diagnosis not present

## 2018-03-01 DIAGNOSIS — Z01812 Encounter for preprocedural laboratory examination: Secondary | ICD-10-CM | POA: Diagnosis not present

## 2018-03-01 HISTORY — DX: Unspecified hearing loss, unspecified ear: H91.90

## 2018-03-01 LAB — CBC
HCT: 40 % (ref 39.0–52.0)
Hemoglobin: 13.1 g/dL (ref 13.0–17.0)
MCH: 32.9 pg (ref 26.0–34.0)
MCHC: 32.8 g/dL (ref 30.0–36.0)
MCV: 100.5 fL — ABNORMAL HIGH (ref 78.0–100.0)
Platelets: 135 10*3/uL — ABNORMAL LOW (ref 150–400)
RBC: 3.98 MIL/uL — ABNORMAL LOW (ref 4.22–5.81)
RDW: 13.3 % (ref 11.5–15.5)
WBC: 4.7 10*3/uL (ref 4.0–10.5)

## 2018-03-01 LAB — BASIC METABOLIC PANEL
Anion gap: 9 (ref 5–15)
BUN: 23 mg/dL — ABNORMAL HIGH (ref 6–20)
CO2: 28 mmol/L (ref 22–32)
Calcium: 9.1 mg/dL (ref 8.9–10.3)
Chloride: 102 mmol/L (ref 101–111)
Creatinine, Ser: 1.74 mg/dL — ABNORMAL HIGH (ref 0.61–1.24)
GFR calc Af Amer: 40 mL/min — ABNORMAL LOW (ref >60)
GFR calc non Af Amer: 35 mL/min — ABNORMAL LOW (ref >60)
Glucose, Bld: 103 mg/dL — ABNORMAL HIGH (ref 65–99)
Potassium: 3.7 mmol/L (ref 3.5–5.1)
Sodium: 139 mmol/L (ref 135–145)

## 2018-03-01 NOTE — Progress Notes (Signed)
bmet results faxed to dr borden by epic

## 2018-03-03 NOTE — H&P (Signed)
CC/HPI: Left hydronephrosis and BPH/bladder outlet obstruction   He presents today to undergo transurethral resection of the prostate for management of his bladder outlet obstruction.    ALLERGIES: None   MEDICATIONS: Cialis 5 mg tablet  Hydrochlorothiazide 25 mg tablet  Omeprazole 10 mg capsule,delayed release  Terazosin Hcl 2 mg capsule  Uloric 40 mg tablet     GU PSH: Complex cystometrogram, w/ void pressure and urethral pressure profile studies, any technique - 01/17/2018 Complex Uroflow - 01/17/2018 Emg surf Electrd - 01/17/2018 Inject For cystogram - 01/17/2018 Intrabd voidng Press - 01/17/2018      PSH Notes: Hand surgery (2002)   NON-GU PSH: None   GU PMH: BPH w/LUTS - 12/16/2017 Hydronephrosis - 12/16/2017 Urinary Retention - 12/16/2017      PMH Notes:   1) BPH/incomplete bladder emptying/hydronephrosis: He presented to me in December 2018 for left hydronephrosis. This was noted to have been present since April 2017 but no evaluation had been performed then. He has had stable CKD with Cr of 1.6 for many years. He was voiding "normally" when he presented to me and was taking terazosin 2 mg. A CT scan was performed in December 2018 for 2-3 months of LLQ discomfort and this indicated a markedly dilated left ureter and left renal pelvis with minimal left renal cortical tissue consistent with a very poorly functional left kidney. His PVR was noted to be > 1 L. I recommended a catheter at that time but he refused.   2) Solitary functional right kidney: His baseline Cr is 1.6 with a poorly functional left kidney due to chronic obstruction.   3) Family history of prostate cancer: His PSA has ranged between 5 and 6.27. His father was diagnosed with prostate cancer later in life.   NON-GU PMH: GERD Gout Hypercholesterolemia Hypertension    FAMILY HISTORY: 2 sons - Son Prostate Cancer - Father   SOCIAL HISTORY: Marital Status: Widowed Preferred Language: English;  Ethnicity: Not Hispanic Or Latino; Race: White Current Smoking Status: Patient does not smoke anymore. Has not smoked since 11/27/1999. Smoked for 35 years. Smoked 1 pack per day.   Tobacco Use Assessment Completed: Used Tobacco in last 30 days? Has never drank.  Does not drink caffeine. Patient's occupation is/was Retired.    REVIEW OF SYSTEMS:    GU Review Male:   Patient reports frequent urination, get up at night to urinate, and leakage of urine. Patient denies hard to postpone urination, burning/ pain with urination, stream starts and stops, trouble starting your streams, and have to strain to urinate .  Gastrointestinal (Upper):   Patient denies nausea and vomiting.  Gastrointestinal (Lower):   Patient denies diarrhea and constipation.  Constitutional:   Patient denies fever, night sweats, weight loss, and fatigue.  Skin:   Patient denies skin rash/ lesion and itching.  Eyes:   Patient denies blurred vision and double vision.  Ears/ Nose/ Throat:   Patient denies sore throat and sinus problems.  Hematologic/Lymphatic:   Patient denies swollen glands and easy bruising.  Cardiovascular:   Patient reports leg swelling. Patient denies chest pains.  Respiratory:   Patient denies cough and shortness of breath.  Endocrine:   Patient denies excessive thirst.  Musculoskeletal:   Patient denies back pain and joint pain.  Neurological:   Patient denies headaches and dizziness.  Psychologic:   Patient denies depression and anxiety.   VITAL SIGNS:     Weight 190 lb / 86.18 kg  Height  72 in / 182.88 cm  BMI 25.8 kg/m    MULTI-SYSTEM PHYSICAL EXAMINATION:    Constitutional: Well-nourished. No physical deformities. Normally developed. Good grooming.  Respiratory: No labored breathing, no use of accessory muscles. Clear bilaterally.  Cardiovascular: Normal temperature, normal extremity pulses, no swelling, no varicosities. Regular rate and rhythm.  Gastrointestinal: No mass, no tenderness,  no rigidity, non obese abdomen.       ASSESSMENT:      ICD-10 Details  1 GU:   Urinary Retention - R33.8   2   Hydronephrosis - N13.0   3   BPH w/LUTS - N40.1    PLAN:       1. Bladder outlet obstruction due to BPH: His findings on his evaluation are consistent with bladder outlet obstruction due to BPH. We discussed his urodynamic study today and a very large bladder capacity and poor bladder sensation. However, surprisingly, he has preserved detrusor function. We therefore discussed the option of proceeding with either additional medication which would be unlikely to be significantly helpful or a bladder outlet obstruction procedure which may help to improve his emptying with a goal of trying to decrease the chance of him developing renal failure related to obstruction of the contralateral kidney and possibly decreasing his risk of complications and incontinence from poor bladder emptying. I also talked with him and again recommended that he consider catheterization either continuously or on an intermittent basis in the meantime. We discussed clean intermittent catheterization as a very reasonable long-term management option. He adamantly refuses to catheterize.   Ultimately, he does wish to proceed with transurethral resection of the prostate. We reviewed this procedure in detail including the potential risks including bleeding, infection, need for postoperative catheterization, urethral stricture/bladder neck contracture, incontinence, erectile dysfunction, retrograde ejaculation, and need for further procedures in the future. He gives informed consent and this will be scheduled.   2. Left hydronephrosis: This is chronic. I have not recommended intervention considering his stable renal function and poor renal parenchyma on that side.

## 2018-03-06 ENCOUNTER — Ambulatory Visit (HOSPITAL_COMMUNITY): Payer: Medicare Other | Admitting: Certified Registered Nurse Anesthetist

## 2018-03-06 ENCOUNTER — Other Ambulatory Visit: Payer: Self-pay

## 2018-03-06 ENCOUNTER — Encounter (HOSPITAL_COMMUNITY)
Admission: RE | Disposition: A | Discharge status: Home / Self Care | Payer: Self-pay | Source: Ambulatory Visit | Attending: Urology

## 2018-03-06 ENCOUNTER — Observation Stay (HOSPITAL_COMMUNITY)
Admission: RE | Admit: 2018-03-06 | Discharge: 2018-03-07 | Disposition: A | Discharge status: Home / Self Care | Payer: Medicare Other | Source: Ambulatory Visit | Attending: Urology | Admitting: Urology

## 2018-03-06 ENCOUNTER — Encounter (HOSPITAL_COMMUNITY): Payer: Self-pay | Admitting: Emergency Medicine

## 2018-03-06 DIAGNOSIS — N400 Enlarged prostate without lower urinary tract symptoms: Secondary | ICD-10-CM | POA: Diagnosis not present

## 2018-03-06 DIAGNOSIS — Z79899 Other long term (current) drug therapy: Secondary | ICD-10-CM | POA: Diagnosis not present

## 2018-03-06 DIAGNOSIS — Z87891 Personal history of nicotine dependence: Secondary | ICD-10-CM | POA: Insufficient documentation

## 2018-03-06 DIAGNOSIS — N401 Enlarged prostate with lower urinary tract symptoms: Secondary | ICD-10-CM | POA: Diagnosis not present

## 2018-03-06 DIAGNOSIS — E785 Hyperlipidemia, unspecified: Secondary | ICD-10-CM | POA: Diagnosis not present

## 2018-03-06 DIAGNOSIS — R338 Other retention of urine: Secondary | ICD-10-CM | POA: Diagnosis not present

## 2018-03-06 DIAGNOSIS — M109 Gout, unspecified: Secondary | ICD-10-CM | POA: Diagnosis not present

## 2018-03-06 DIAGNOSIS — I10 Essential (primary) hypertension: Secondary | ICD-10-CM | POA: Insufficient documentation

## 2018-03-06 DIAGNOSIS — N320 Bladder-neck obstruction: Secondary | ICD-10-CM | POA: Diagnosis present

## 2018-03-06 DIAGNOSIS — K219 Gastro-esophageal reflux disease without esophagitis: Secondary | ICD-10-CM | POA: Diagnosis not present

## 2018-03-06 DIAGNOSIS — N1330 Unspecified hydronephrosis: Secondary | ICD-10-CM | POA: Diagnosis not present

## 2018-03-06 DIAGNOSIS — N138 Other obstructive and reflux uropathy: Secondary | ICD-10-CM | POA: Insufficient documentation

## 2018-03-06 HISTORY — PX: CYSTOSCOPY: SHX5120

## 2018-03-06 HISTORY — PX: TRANSURETHRAL RESECTION OF PROSTATE: SHX73

## 2018-03-06 SURGERY — TURP (TRANSURETHRAL RESECTION OF PROSTATE)
Anesthesia: General

## 2018-03-06 MED ORDER — PANTOPRAZOLE SODIUM 40 MG PO TBEC: 40.0000 mg | DELAYED_RELEASE_TABLET | Freq: Every day | ORAL | Status: DC

## 2018-03-06 MED ORDER — DEXAMETHASONE SODIUM PHOSPHATE 10 MG/ML IJ SOLN
INTRAMUSCULAR | Status: AC
  Filled 2018-03-06: qty 1

## 2018-03-06 MED ORDER — STERILE WATER FOR IRRIGATION IR SOLN
Status: DC | PRN
  Administered 2018-03-06: 500 mL

## 2018-03-06 MED ORDER — DOCUSATE SODIUM 100 MG PO CAPS
100.0000 mg | ORAL_CAPSULE | Freq: Two times a day (BID) | ORAL | Status: DC
  Filled 2018-03-06: qty 1

## 2018-03-06 MED ORDER — PROPOFOL 10 MG/ML IV BOLUS
INTRAVENOUS | Status: AC
  Filled 2018-03-06: qty 20

## 2018-03-06 MED ORDER — SODIUM CHLORIDE 0.45 % IV SOLN
INTRAVENOUS | Status: DC
  Administered 2018-03-07: via INTRAVENOUS

## 2018-03-06 MED ORDER — FENTANYL CITRATE (PF) 100 MCG/2ML IJ SOLN: 25.0000 ug | INTRAMUSCULAR | Status: DC | PRN

## 2018-03-06 MED ORDER — SODIUM CHLORIDE 0.9% FLUSH: 3.0000 mL | Freq: Two times a day (BID) | INTRAVENOUS | Status: DC

## 2018-03-06 MED ORDER — ATORVASTATIN CALCIUM 20 MG PO TABS: 20.0000 mg | ORAL_TABLET | Freq: Every day | ORAL | Status: DC

## 2018-03-06 MED ORDER — DIPHENHYDRAMINE HCL 12.5 MG/5ML PO ELIX: 12.5000 mg | ORAL_SOLUTION | Freq: Four times a day (QID) | ORAL | Status: DC | PRN

## 2018-03-06 MED ORDER — SODIUM CHLORIDE 0.9 % IV SOLN: 250.0000 mL | INTRAVENOUS | Status: DC | PRN

## 2018-03-06 MED ORDER — DEXAMETHASONE SODIUM PHOSPHATE 10 MG/ML IJ SOLN
INTRAMUSCULAR | Status: DC | PRN
  Administered 2018-03-06: 10 mg via INTRAVENOUS

## 2018-03-06 MED ORDER — OXYCODONE HCL 5 MG PO TABS: 5.0000 mg | ORAL_TABLET | Freq: Once | ORAL | Status: DC | PRN

## 2018-03-06 MED ORDER — SODIUM CHLORIDE 0.9 % IR SOLN
Status: DC | PRN
  Administered 2018-03-06: 24000 mL

## 2018-03-06 MED ORDER — PHENYLEPHRINE 40 MCG/ML (10ML) SYRINGE FOR IV PUSH (FOR BLOOD PRESSURE SUPPORT)
PREFILLED_SYRINGE | INTRAVENOUS | Status: AC
  Filled 2018-03-06: qty 10

## 2018-03-06 MED ORDER — ONDANSETRON HCL 4 MG/2ML IJ SOLN
INTRAMUSCULAR | Status: AC
  Filled 2018-03-06: qty 2

## 2018-03-06 MED ORDER — LIDOCAINE 2% (20 MG/ML) 5 ML SYRINGE
INTRAMUSCULAR | Status: DC | PRN
  Administered 2018-03-06: 100 mg via INTRAVENOUS

## 2018-03-06 MED ORDER — ACETAMINOPHEN 325 MG PO TABS: 650.0000 mg | ORAL_TABLET | ORAL | Status: DC | PRN

## 2018-03-06 MED ORDER — ONDANSETRON HCL 4 MG/2ML IJ SOLN: 4.0000 mg | Freq: Four times a day (QID) | INTRAMUSCULAR | Status: DC | PRN

## 2018-03-06 MED ORDER — PHENAZOPYRIDINE HCL 100 MG PO TABS: 100.0000 mg | ORAL_TABLET | Freq: Three times a day (TID) | ORAL | 0 refills | Status: DC | PRN

## 2018-03-06 MED ORDER — FENTANYL CITRATE (PF) 100 MCG/2ML IJ SOLN
INTRAMUSCULAR | Status: AC
  Filled 2018-03-06: qty 2

## 2018-03-06 MED ORDER — HYDROCHLOROTHIAZIDE 25 MG PO TABS: 25.0000 mg | ORAL_TABLET | Freq: Every day | ORAL | Status: DC

## 2018-03-06 MED ORDER — FENTANYL CITRATE (PF) 100 MCG/2ML IJ SOLN
INTRAMUSCULAR | Status: DC | PRN
  Administered 2018-03-06: 25 ug via INTRAVENOUS
  Administered 2018-03-06: 50 ug via INTRAVENOUS
  Administered 2018-03-06: 25 ug via INTRAVENOUS
  Administered 2018-03-06 (×2): 50 ug via INTRAVENOUS

## 2018-03-06 MED ORDER — SODIUM CHLORIDE 0.9% FLUSH: 3.0000 mL | INTRAVENOUS | Status: DC | PRN

## 2018-03-06 MED ORDER — DIPHENHYDRAMINE HCL 50 MG/ML IJ SOLN: 12.5000 mg | Freq: Four times a day (QID) | INTRAMUSCULAR | Status: DC | PRN

## 2018-03-06 MED ORDER — ONDANSETRON HCL 4 MG/2ML IJ SOLN: 4.0000 mg | INTRAMUSCULAR | Status: DC | PRN

## 2018-03-06 MED ORDER — 0.9 % SODIUM CHLORIDE (POUR BTL) OPTIME
TOPICAL | Status: DC | PRN
  Administered 2018-03-06: 1000 mL

## 2018-03-06 MED ORDER — HYDROCODONE-ACETAMINOPHEN 5-325 MG PO TABS
1.0000 | ORAL_TABLET | ORAL | Status: DC | PRN
Start: 2018-03-06 — End: 2018-03-07

## 2018-03-06 MED ORDER — FEBUXOSTAT 40 MG PO TABS
40.0000 mg | ORAL_TABLET | Freq: Every day | ORAL | Status: DC
  Filled 2018-03-06: qty 1

## 2018-03-06 MED ORDER — ONDANSETRON HCL 4 MG/2ML IJ SOLN
INTRAMUSCULAR | Status: DC | PRN
  Administered 2018-03-06: 4 mg via INTRAVENOUS

## 2018-03-06 MED ORDER — PHENYLEPHRINE 40 MCG/ML (10ML) SYRINGE FOR IV PUSH (FOR BLOOD PRESSURE SUPPORT)
PREFILLED_SYRINGE | INTRAVENOUS | Status: DC | PRN
  Administered 2018-03-06: 80 ug via INTRAVENOUS
  Administered 2018-03-06: 120 ug via INTRAVENOUS
  Administered 2018-03-06 (×3): 80 ug via INTRAVENOUS

## 2018-03-06 MED ORDER — OXYCODONE HCL 5 MG/5ML PO SOLN
5.0000 mg | Freq: Once | ORAL | Status: DC | PRN
  Filled 2018-03-06: qty 5

## 2018-03-06 MED ORDER — TRAMADOL HCL 50 MG PO TABS: 50.0000 mg | ORAL_TABLET | Freq: Four times a day (QID) | ORAL | 0 refills | Status: DC | PRN

## 2018-03-06 MED ORDER — CEFTRIAXONE SODIUM 2 G IJ SOLR
2.0000 g | Freq: Once | INTRAMUSCULAR | Status: AC
  Administered 2018-03-06: 2 g via INTRAVENOUS
  Filled 2018-03-06: qty 20

## 2018-03-06 MED ORDER — PROPOFOL 10 MG/ML IV BOLUS
INTRAVENOUS | Status: DC | PRN
  Administered 2018-03-06: 130 mg via INTRAVENOUS

## 2018-03-06 MED ORDER — LACTATED RINGERS IV SOLN
INTRAVENOUS | Status: DC
  Administered 2018-03-06 (×2): via INTRAVENOUS

## 2018-03-06 SURGICAL SUPPLY — 22 items
BAG URINE DRAINAGE (UROLOGICAL SUPPLIES) ×2 IMPLANT
BAG URO CATCHER STRL LF (MISCELLANEOUS) ×2 IMPLANT
CATH HEMA 3WAY 30CC 22FR COUDE (CATHETERS) ×2 IMPLANT
CATH INTERMIT  6FR 70CM (CATHETERS) IMPLANT
CLOTH BEACON ORANGE TIMEOUT ST (SAFETY) ×2 IMPLANT
COVER FOOTSWITCH UNIV (MISCELLANEOUS) IMPLANT
ELECT REM PT RETURN 15FT ADLT (MISCELLANEOUS) IMPLANT
GLOVE BIOGEL M STRL SZ7.5 (GLOVE) ×2 IMPLANT
GLOVE BIOGEL PI IND STRL 7.5 (GLOVE) ×1 IMPLANT
GLOVE BIOGEL PI INDICATOR 7.5 (GLOVE) ×1
GOWN STRL REUS W/ TWL XL LVL3 (GOWN DISPOSABLE) ×1 IMPLANT
GOWN STRL REUS W/TWL LRG LVL3 (GOWN DISPOSABLE) ×2 IMPLANT
GOWN STRL REUS W/TWL XL LVL3 (GOWN DISPOSABLE) ×3 IMPLANT
GUIDEWIRE STR DUAL SENSOR (WIRE) IMPLANT
HOLDER FOLEY CATH W/STRAP (MISCELLANEOUS) ×2 IMPLANT
LOOP CUT BIPOLAR 24F LRG (ELECTROSURGICAL) ×2 IMPLANT
MANIFOLD NEPTUNE II (INSTRUMENTS) ×2 IMPLANT
PACK CYSTO (CUSTOM PROCEDURE TRAY) ×2 IMPLANT
SET ASPIRATION TUBING (TUBING) IMPLANT
SYRINGE IRR TOOMEY STRL 70CC (SYRINGE) ×2 IMPLANT
TUBING CONNECTING 10 (TUBING) ×2 IMPLANT
TUBING UROLOGY SET (TUBING) ×2 IMPLANT

## 2018-03-06 NOTE — Transfer of Care (Signed)
Immediate Anesthesia Transfer of Care Note  Patient: Steven Pearson  Procedure(s) Performed: TRANSURETHRAL RESECTION OF THE PROSTATE (TURP) (N/A ) CYSTOSCOPY (N/A )  Patient Location: PACU  Anesthesia Type:General  Level of Consciousness: drowsy and patient cooperative  Airway & Oxygen Therapy: Patient Spontanous Breathing and Patient connected to face mask oxygen  Post-op Assessment: Report given to RN and Post -op Vital signs reviewed and stable  Post vital signs: Reviewed and stable  Last Vitals:  Vitals:   03/06/18 1301  BP: (!) 141/90  Pulse: 91  Resp: 18  Temp: 36.6 C  SpO2: 98%    Last Pain:  Vitals:   03/06/18 1301  TempSrc: Oral      Patients Stated Pain Goal: 4 (64/38/38 1840)  Complications: No apparent anesthesia complications

## 2018-03-06 NOTE — Progress Notes (Signed)
Patient ID: Steven Pearson, male   DOB: January 26, 1932, 84 y.o.   MRN: [id number] Post-op note  Subjective: The patient is doing well.  No complaints.  Objective: Vital signs in last 24 hours: Temp:  [97.4 F (36.3 C)-98.6 F (37 C)] 97.5 F (36.4 C) (03/11 1715) Pulse Rate:  [74-91] 83 (03/11 1715) Resp:  [11-20] 11 (03/11 1700) BP: (134-144)/(70-90) 144/77 (03/11 1715) SpO2:  [96 %-100 %] 99 % (03/11 1715) Weight:  [88.5 kg (195 lb)] 88.5 kg (195 lb) (03/11 1326)  Intake/Output from previous day: No intake/output data recorded. Intake/Output this shift: Total I/O In: 2100 [I.V.:1000; Other:1100] Out: 2400 [Urine:2300; Blood:100]  Physical Exam:  General: Alert and oriented. Abdomen: Soft, Nondistended. Incisions: Clean and dry. GU: Urine pink on moderate CBI.  Lab Results:  Assessment/Plan: POD#0   1) Continue to monitor, wean CBI overnight   Pryor Curia. MD   LOS: 0 days   Dillyn Joaquin,LES 03/06/2018, 5:49 PM

## 2018-03-06 NOTE — Op Note (Signed)
Preoperative diagnosis: 1. Bladder outlet obstruction secondary to BPH  Postoperative diagnosis:  1. 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 Cystoscopy 2. Transurethral resection of the prostate  Surgeon: Pryor Curia. M.D.  Anesthesia: General  Complications: None  EBL: Minimal  Specimens: 1. Prostate chips  Disposition of specimens: Pathology  Indication: Steven Pearson is a patient with bladder outlet obstruction secondary to benign prostatic hyperplasia. After reviewing the management options for treatment, he elected to proceed with the above surgical procedure(s). We have discussed the potential benefits and risks of the procedure, side effects of the proposed treatment, the likelihood of the patient achieving the goals of the procedure, and any potential problems that might occur during the procedure or recuperation. Informed consent has been obtained.  Description of procedure:  The patient was taken to the operating room and general anesthesia was induced.  The patient was placed in the dorsal lithotomy position, prepped and draped in the usual sterile fashion, and preoperative antibiotics were administered. A preoperative time-out was performed.   Cystourethroscopy was performed.  The patient's urethra was examined and demonstrated bilobar prostatic hypertrophy.   The bladder was then systematically examined in its entirety. There was no evidence of any bladder tumors, stones, or other mucosal pathology.  The ureteral orifices were identified and marked so as to be avoided during the procedure.  The prostate adenoma was then resected utilizing loop cautery resection with the monopolar/bipolar cutting loop.  The prostate adenoma from the bladder neck back to the verumontanum was resected beginning at the six o'clock position and then extended to include the right and left lobes of the prostate and anterior prostate. Care was taken not to resect  distal to the verumontanum.  Hemostasis was then achieved with the cautery and the bladder was emptied and reinspected with no significant bleeding noted at the end of the procedure.    A 3 way catheter was then placed into the bladder and placed on continuous bladder irrigation.  The patient appeared to tolerate the procedure well and without complications.  The patient was able to be awakened and transferred to the recovery unit in satisfactory condition.

## 2018-03-06 NOTE — Anesthesia Preprocedure Evaluation (Signed)
Anesthesia Evaluation  Patient identified by MRN, date of birth, ID band Patient awake    Reviewed: Allergy & Precautions, H&P , NPO status , Patient's Chart, lab work & pertinent test results  Airway Mallampati: II   Neck ROM: full    Dental   Pulmonary former smoker,    breath sounds clear to auscultation       Cardiovascular hypertension, + dysrhythmias  Rhythm:regular Rate:Normal  PVCs   Neuro/Psych    GI/Hepatic GERD  ,  Endo/Other    Renal/GU Renal InsufficiencyRenal disease     Musculoskeletal   Abdominal   Peds  Hematology   Anesthesia Other Findings   Reproductive/Obstetrics                             Anesthesia Physical Anesthesia Plan  ASA: III  Anesthesia Plan: General   Post-op Pain Management:    Induction: Intravenous  PONV Risk Score and Plan: 2 and Ondansetron, Dexamethasone and Treatment may vary due to age or medical condition  Airway Management Planned: LMA  Additional Equipment:   Intra-op Plan:   Post-operative Plan:   Informed Consent: I have reviewed the patients History and Physical, chart, labs and discussed the procedure including the risks, benefits and alternatives for the proposed anesthesia with the patient or authorized representative who has indicated his/her understanding and acceptance.     Plan Discussed with: CRNA, Anesthesiologist and Surgeon  Anesthesia Plan Comments:         Anesthesia Quick Evaluation

## 2018-03-06 NOTE — Anesthesia Procedure Notes (Signed)
Procedure Name: LMA Insertion Date/Time: 03/06/2018 2:58 PM Performed by: Montel Clock, CRNA Pre-anesthesia Checklist: Patient identified, Emergency Drugs available, Suction available, Patient being monitored and Timeout performed Patient Re-evaluated:Patient Re-evaluated prior to induction Oxygen Delivery Method: Circle system utilized Preoxygenation: Pre-oxygenation with 100% oxygen Induction Type: IV induction LMA: LMA with gastric port inserted LMA Size: 4.0 Number of attempts: 1 Dental Injury: Teeth and Oropharynx as per pre-operative assessment

## 2018-03-07 ENCOUNTER — Encounter (HOSPITAL_COMMUNITY): Payer: Self-pay | Admitting: Urology

## 2018-03-07 DIAGNOSIS — E785 Hyperlipidemia, unspecified: Secondary | ICD-10-CM | POA: Diagnosis not present

## 2018-03-07 DIAGNOSIS — N1330 Unspecified hydronephrosis: Secondary | ICD-10-CM | POA: Diagnosis not present

## 2018-03-07 DIAGNOSIS — N401 Enlarged prostate with lower urinary tract symptoms: Secondary | ICD-10-CM | POA: Diagnosis not present

## 2018-03-07 DIAGNOSIS — R338 Other retention of urine: Secondary | ICD-10-CM | POA: Diagnosis not present

## 2018-03-07 DIAGNOSIS — I10 Essential (primary) hypertension: Secondary | ICD-10-CM | POA: Diagnosis not present

## 2018-03-07 DIAGNOSIS — N138 Other obstructive and reflux uropathy: Secondary | ICD-10-CM | POA: Diagnosis not present

## 2018-03-07 MED ORDER — SODIUM CHLORIDE 0.9 % IR SOLN
3000.0000 mL | Status: DC
  Administered 2018-03-07 (×2): 3000 mL

## 2018-03-07 MED ORDER — SODIUM CHLORIDE 0.9 % IV SOLN
Freq: Once | INTRAVENOUS | Status: AC
  Administered 2018-03-07: 15:00:00 via INTRAVENOUS

## 2018-03-07 MED ORDER — TRAMADOL HCL 50 MG PO TABS: 50.0000 mg | ORAL_TABLET | Freq: Four times a day (QID) | ORAL | Status: DC | PRN

## 2018-03-07 NOTE — Progress Notes (Signed)
Patient is being discharged home. Discharge instructions were given to patient and family 

## 2018-03-07 NOTE — Progress Notes (Signed)
Patient ID: Steven Pearson, male   DOB: 1932-10-05, 84 y.o.   MRN: [id number]  1 Day Post-Op Subjective: Doing well.  No complaints.  Objective: Vital signs in last 24 hours: Temp:  [97.4 F (36.3 C)-98.2 F (36.8 C)] 98.2 F (36.8 C) (03/12 0439) Pulse Rate:  [73-97] 73 (03/12 0439) Resp:  [11-20] 20 (03/12 0439) BP: (105-144)/(49-90) 106/57 (03/12 0439) SpO2:  [95 %-100 %] 96 % (03/12 0439) Weight:  [88.5 kg (195 lb)] 88.5 kg (195 lb) (03/11 1326)  Intake/Output from previous day: 03/11 0701 - 03/12 0700 In: 7520 [I.V.:1420] Out: 10500 [Urine:10400; Blood:100] Intake/Output this shift: No intake/output data recorded.  Physical Exam:  General: Alert and oriented Abd: Soft, ND GU: Urine light pink with CBI off   Studies/Results: No results found.  Assessment/Plan: S/P TURP yesterday - D/C catheter and SL IVF - Voiding trial and likely plan for d/c later today    LOS: 0 days   Cynda Soule,LES 03/07/2018, 7:54 AM

## 2018-03-07 NOTE — Discharge Summary (Signed)
Date of admission: 03/06/2018  Date of discharge: 03/07/2018  Admission diagnosis: Bladder outlet obstruction  Discharge diagnosis:  Bladder outlet obstruction  Secondary diagnoses:  Hyperlipidemia, gout, hypertension  History and Physical: For full details, please see admission history and physical. Briefly, Steven Pearson is a 84 y.o. year old patient with bladder outlet obstruction and a solitary functional kidney.  He elected to undergo transurethral resection of the prostate for treatment.  Hospital Course:   He was taken to the operating room on 03/06/2018 and underwent cystoscopy with transurethral resection of the prostate.  He tolerated his procedure well without complications.  He was maintained on continuous bladder irrigation overnight and the following morning his urine was relatively clear with minimal irrigation.  His continuous bladder irrigation was able to be stopping his catheter was able to be removed for a voiding trial.  He was unable to void through much of the day but eventually did void twice at the end of the day and he was able to be discharged home.  Laboratory values: No results for input(s): HGB, HCT in the last 72 hours. No results for input(s): CREATININE in the last 72 hours.  Disposition: Home  Discharge instruction: The patient was instructed to be ambulatory but told to refrain from heavy lifting, strenuous activity, or driving.   Discharge medications:  Allergies as of 03/07/2018   No Known Allergies     Medication List    STOP taking these medications   multivitamin with minerals Tabs tablet   naproxen sodium 220 MG tablet Commonly known as:  ALEVE   tadalafil 5 MG tablet Commonly known as:  CIALIS   terazosin 2 MG capsule Commonly known as:  HYTRIN   vitamin E 400 UNIT capsule     TAKE these medications   AMBULATORY NON FORMULARY MEDICATION Knee-high, medium compression, graduated compression stockings. Apply to lower extremities.    atorvastatin 20 MG tablet Commonly known as:  LIPITOR Take 1 tablet (20 mg total) by mouth daily.   B-12 5000 MCG Caps Take 5,000 mcg by mouth daily.   febuxostat 40 MG tablet Commonly known as:  ULORIC Take 1 tablet (40 mg total) by mouth daily.   folic acid 353 MCG tablet Commonly known as:  FOLVITE Take 800 mcg by mouth daily.   hydrochlorothiazide 12.5 MG tablet Commonly known as:  HYDRODIURIL Take 1 tablet (12.5 mg total) by mouth 2 (two) times daily. What changed:    how much to take  when to take this   NON FORMULARY Take 120 mLs by mouth daily. Kefir probiotic otc   omeprazole 10 MG capsule Commonly known as:  PRILOSEC Take 1 capsule (10 mg total) by mouth daily.   phenazopyridine 100 MG tablet Commonly known as:  PYRIDIUM Take 1 tablet (100 mg total) by mouth 3 (three) times daily as needed for pain (for burning).   traMADol 50 MG tablet Commonly known as:  ULTRAM Take 1 tablet (50 mg total) by mouth every 6 (six) hours as needed.   Ubiquinol 100 MG Caps Take 100 mg by mouth daily.   Vitamin D-3 5000 units Tabs Take 5,000 Units by mouth every other day.   VITAMIN K PO Take 1 tablet by mouth daily.       Followup:  Follow-up Information    Raynelle Bring, MD.   Specialty:  Urology Why:  04/05/18 at 12:30 PM Contact information: [address] [place] [address] [phone]

## 2018-03-07 NOTE — Progress Notes (Signed)
Patient ID: Steven Pearson, male   DOB: 01-16-1932, 84 y.o.   MRN: [id number]  Pt did not void earlier today.  He was given a 500 cc NS bolus and continued to drink lots of fluid.   He states that he has now voided x 2 but this was not recorded or kept for a string of bottles.   I discussed options with Steven Pearson including continuing observation with a voiding trial in the hospital vs catheter placement vs going home without a catheter.  He has chosen the latter option.  He understands to present to the ED tonight if he becomes unable to void or call our office tomorrow if having trouble.  I will schedule him for an OV later this week regardless to check PVR and renal function.

## 2018-03-07 NOTE — Care Management Obs Status (Signed)
[facility] NOTIFICATION   Patient Details  Name: Steven Pearson MRN: [id number] Date of Birth: Oct 04, 1932   Medicare Observation Status Notification Given:  Yes    Dessa Phi, RN 03/07/2018, 12:27 PM

## 2018-03-09 NOTE — Anesthesia Postprocedure Evaluation (Signed)
Anesthesia Post Note  Patient: Steven Pearson  Procedure(s) Performed: TRANSURETHRAL RESECTION OF THE PROSTATE (TURP) (N/A ) CYSTOSCOPY (N/A )     Patient location during evaluation: PACU Anesthesia Type: General Level of consciousness: awake and alert Pain management: pain level controlled Vital Signs Assessment: post-procedure vital signs reviewed and stable Respiratory status: spontaneous breathing, nonlabored ventilation, respiratory function stable and patient connected to nasal cannula oxygen Cardiovascular status: blood pressure returned to baseline and stable Postop Assessment: no apparent nausea or vomiting Anesthetic complications: no    Last Vitals:  Vitals:   03/07/18 0900 03/07/18 1232  BP: 99/64 111/60  Pulse: 97 75  Resp: 12   Temp: (!) 36.3 C (!) 36.3 C  SpO2: 95% 99%    Last Pain:  Vitals:   03/07/18 1400  TempSrc:   PainSc: 0-No pain                 [name] S

## 2018-03-13 DIAGNOSIS — R338 Other retention of urine: Secondary | ICD-10-CM | POA: Diagnosis not present

## 2018-04-04 DIAGNOSIS — N130 Hydronephrosis with ureteropelvic junction obstruction: Secondary | ICD-10-CM | POA: Diagnosis not present

## 2018-04-05 DIAGNOSIS — R8271 Bacteriuria: Secondary | ICD-10-CM | POA: Diagnosis not present

## 2018-04-05 DIAGNOSIS — R3914 Feeling of incomplete bladder emptying: Secondary | ICD-10-CM | POA: Diagnosis not present

## 2018-04-05 DIAGNOSIS — N401 Enlarged prostate with lower urinary tract symptoms: Secondary | ICD-10-CM | POA: Diagnosis not present

## 2018-04-05 DIAGNOSIS — N130 Hydronephrosis with ureteropelvic junction obstruction: Secondary | ICD-10-CM | POA: Diagnosis not present

## 2018-06-02 DIAGNOSIS — C4441 Basal cell carcinoma of skin of scalp and neck: Secondary | ICD-10-CM | POA: Diagnosis not present

## 2018-06-02 DIAGNOSIS — L570 Actinic keratosis: Secondary | ICD-10-CM | POA: Diagnosis not present

## 2018-06-02 DIAGNOSIS — L814 Other melanin hyperpigmentation: Secondary | ICD-10-CM | POA: Diagnosis not present

## 2018-06-02 DIAGNOSIS — Z85828 Personal history of other malignant neoplasm of skin: Secondary | ICD-10-CM | POA: Diagnosis not present

## 2018-06-02 DIAGNOSIS — D485 Neoplasm of uncertain behavior of skin: Secondary | ICD-10-CM | POA: Diagnosis not present

## 2018-07-07 DIAGNOSIS — L570 Actinic keratosis: Secondary | ICD-10-CM | POA: Diagnosis not present

## 2018-07-07 DIAGNOSIS — L814 Other melanin hyperpigmentation: Secondary | ICD-10-CM | POA: Diagnosis not present

## 2018-07-07 DIAGNOSIS — C4441 Basal cell carcinoma of skin of scalp and neck: Secondary | ICD-10-CM | POA: Diagnosis not present

## 2018-08-14 DIAGNOSIS — H26493 Other secondary cataract, bilateral: Secondary | ICD-10-CM | POA: Diagnosis not present

## 2018-08-14 DIAGNOSIS — H52203 Unspecified astigmatism, bilateral: Secondary | ICD-10-CM | POA: Diagnosis not present

## 2018-08-14 DIAGNOSIS — H5203 Hypermetropia, bilateral: Secondary | ICD-10-CM | POA: Diagnosis not present

## 2018-08-14 DIAGNOSIS — H532 Diplopia: Secondary | ICD-10-CM | POA: Diagnosis not present

## 2018-08-14 DIAGNOSIS — H43813 Vitreous degeneration, bilateral: Secondary | ICD-10-CM | POA: Diagnosis not present

## 2018-08-14 DIAGNOSIS — H524 Presbyopia: Secondary | ICD-10-CM | POA: Diagnosis not present

## 2018-08-14 DIAGNOSIS — D3132 Benign neoplasm of left choroid: Secondary | ICD-10-CM | POA: Diagnosis not present

## 2018-08-14 DIAGNOSIS — H04122 Dry eye syndrome of left lacrimal gland: Secondary | ICD-10-CM | POA: Diagnosis not present

## 2018-09-20 DIAGNOSIS — Z23 Encounter for immunization: Secondary | ICD-10-CM | POA: Diagnosis not present

## 2018-09-27 DIAGNOSIS — I10 Essential (primary) hypertension: Secondary | ICD-10-CM | POA: Diagnosis not present

## 2018-09-27 DIAGNOSIS — R600 Localized edema: Secondary | ICD-10-CM | POA: Diagnosis not present

## 2018-10-18 DIAGNOSIS — M1A9XX0 Chronic gout, unspecified, without tophus (tophi): Secondary | ICD-10-CM | POA: Diagnosis not present

## 2018-10-18 DIAGNOSIS — Z0189 Encounter for other specified special examinations: Secondary | ICD-10-CM | POA: Diagnosis not present

## 2018-10-18 DIAGNOSIS — R7309 Other abnormal glucose: Secondary | ICD-10-CM | POA: Diagnosis not present

## 2018-10-18 DIAGNOSIS — I10 Essential (primary) hypertension: Secondary | ICD-10-CM | POA: Diagnosis not present

## 2018-10-18 DIAGNOSIS — Z5181 Encounter for therapeutic drug level monitoring: Secondary | ICD-10-CM | POA: Diagnosis not present

## 2018-10-18 DIAGNOSIS — R600 Localized edema: Secondary | ICD-10-CM | POA: Diagnosis not present

## 2018-10-18 DIAGNOSIS — Z87891 Personal history of nicotine dependence: Secondary | ICD-10-CM | POA: Diagnosis not present

## 2018-10-18 DIAGNOSIS — E782 Mixed hyperlipidemia: Secondary | ICD-10-CM | POA: Diagnosis not present

## 2018-10-18 DIAGNOSIS — Z79899 Other long term (current) drug therapy: Secondary | ICD-10-CM | POA: Diagnosis not present

## 2018-10-18 DIAGNOSIS — N1330 Unspecified hydronephrosis: Secondary | ICD-10-CM | POA: Diagnosis not present

## 2018-10-18 DIAGNOSIS — I700 Atherosclerosis of aorta: Secondary | ICD-10-CM | POA: Diagnosis not present

## 2018-10-24 DIAGNOSIS — N183 Chronic kidney disease, stage 3 (moderate): Secondary | ICD-10-CM | POA: Diagnosis not present

## 2018-10-24 DIAGNOSIS — I2510 Atherosclerotic heart disease of native coronary artery without angina pectoris: Secondary | ICD-10-CM | POA: Diagnosis not present

## 2018-10-24 DIAGNOSIS — R600 Localized edema: Secondary | ICD-10-CM | POA: Diagnosis not present

## 2018-10-24 DIAGNOSIS — Z0000 Encounter for general adult medical examination without abnormal findings: Secondary | ICD-10-CM | POA: Diagnosis not present

## 2018-10-24 DIAGNOSIS — M1A9XX0 Chronic gout, unspecified, without tophus (tophi): Secondary | ICD-10-CM | POA: Diagnosis not present

## 2018-10-24 DIAGNOSIS — E782 Mixed hyperlipidemia: Secondary | ICD-10-CM | POA: Diagnosis not present

## 2018-10-24 DIAGNOSIS — I129 Hypertensive chronic kidney disease with stage 1 through stage 4 chronic kidney disease, or unspecified chronic kidney disease: Secondary | ICD-10-CM | POA: Diagnosis not present

## 2018-10-24 DIAGNOSIS — K219 Gastro-esophageal reflux disease without esophagitis: Secondary | ICD-10-CM | POA: Diagnosis not present

## 2018-10-24 DIAGNOSIS — N400 Enlarged prostate without lower urinary tract symptoms: Secondary | ICD-10-CM | POA: Diagnosis not present

## 2018-10-25 DIAGNOSIS — N130 Hydronephrosis with ureteropelvic junction obstruction: Secondary | ICD-10-CM | POA: Diagnosis not present

## 2018-10-25 DIAGNOSIS — N401 Enlarged prostate with lower urinary tract symptoms: Secondary | ICD-10-CM | POA: Diagnosis not present

## 2018-10-25 DIAGNOSIS — R3914 Feeling of incomplete bladder emptying: Secondary | ICD-10-CM | POA: Diagnosis not present

## 2019-01-24 DIAGNOSIS — H26493 Other secondary cataract, bilateral: Secondary | ICD-10-CM | POA: Diagnosis not present

## 2019-01-24 DIAGNOSIS — H52203 Unspecified astigmatism, bilateral: Secondary | ICD-10-CM | POA: Diagnosis not present

## 2019-01-24 DIAGNOSIS — H524 Presbyopia: Secondary | ICD-10-CM | POA: Diagnosis not present

## 2019-01-24 DIAGNOSIS — H04122 Dry eye syndrome of left lacrimal gland: Secondary | ICD-10-CM | POA: Diagnosis not present

## 2019-01-24 DIAGNOSIS — H5203 Hypermetropia, bilateral: Secondary | ICD-10-CM | POA: Diagnosis not present

## 2019-01-24 DIAGNOSIS — H4912 Fourth [trochlear] nerve palsy, left eye: Secondary | ICD-10-CM | POA: Diagnosis not present

## 2019-04-25 DIAGNOSIS — I129 Hypertensive chronic kidney disease with stage 1 through stage 4 chronic kidney disease, or unspecified chronic kidney disease: Secondary | ICD-10-CM | POA: Diagnosis not present

## 2019-04-25 DIAGNOSIS — N183 Chronic kidney disease, stage 3 (moderate): Secondary | ICD-10-CM | POA: Diagnosis not present

## 2019-04-25 DIAGNOSIS — Z0189 Encounter for other specified special examinations: Secondary | ICD-10-CM | POA: Diagnosis not present

## 2019-04-26 DIAGNOSIS — M1A9XX0 Chronic gout, unspecified, without tophus (tophi): Secondary | ICD-10-CM | POA: Diagnosis not present

## 2019-04-26 DIAGNOSIS — N183 Chronic kidney disease, stage 3 (moderate): Secondary | ICD-10-CM | POA: Diagnosis not present

## 2019-04-26 DIAGNOSIS — E782 Mixed hyperlipidemia: Secondary | ICD-10-CM | POA: Diagnosis not present

## 2019-05-24 DIAGNOSIS — R3914 Feeling of incomplete bladder emptying: Secondary | ICD-10-CM | POA: Diagnosis not present

## 2019-05-24 DIAGNOSIS — N130 Hydronephrosis with ureteropelvic junction obstruction: Secondary | ICD-10-CM | POA: Diagnosis not present

## 2019-05-29 DIAGNOSIS — R338 Other retention of urine: Secondary | ICD-10-CM | POA: Diagnosis not present

## 2019-06-04 DIAGNOSIS — R319 Hematuria, unspecified: Secondary | ICD-10-CM | POA: Diagnosis not present

## 2019-06-04 DIAGNOSIS — N390 Urinary tract infection, site not specified: Secondary | ICD-10-CM | POA: Diagnosis not present

## 2019-06-04 DIAGNOSIS — B9629 Other Escherichia coli [E. coli] as the cause of diseases classified elsewhere: Secondary | ICD-10-CM | POA: Diagnosis not present

## 2019-06-08 DIAGNOSIS — N390 Urinary tract infection, site not specified: Secondary | ICD-10-CM | POA: Diagnosis not present

## 2019-06-08 DIAGNOSIS — N183 Chronic kidney disease, stage 3 (moderate): Secondary | ICD-10-CM | POA: Diagnosis not present

## 2019-06-08 DIAGNOSIS — N401 Enlarged prostate with lower urinary tract symptoms: Secondary | ICD-10-CM | POA: Diagnosis not present

## 2019-06-08 DIAGNOSIS — R801 Persistent proteinuria, unspecified: Secondary | ICD-10-CM | POA: Diagnosis not present

## 2019-06-08 DIAGNOSIS — M1A30X0 Chronic gout due to renal impairment, unspecified site, without tophus (tophi): Secondary | ICD-10-CM | POA: Diagnosis not present

## 2019-06-08 DIAGNOSIS — N138 Other obstructive and reflux uropathy: Secondary | ICD-10-CM | POA: Diagnosis not present

## 2019-06-08 DIAGNOSIS — B9620 Unspecified Escherichia coli [E. coli] as the cause of diseases classified elsewhere: Secondary | ICD-10-CM | POA: Diagnosis not present

## 2019-06-12 DIAGNOSIS — R3914 Feeling of incomplete bladder emptying: Secondary | ICD-10-CM | POA: Diagnosis not present

## 2019-08-01 DIAGNOSIS — N183 Chronic kidney disease, stage 3 (moderate): Secondary | ICD-10-CM | POA: Diagnosis not present

## 2019-08-01 DIAGNOSIS — Z0189 Encounter for other specified special examinations: Secondary | ICD-10-CM | POA: Diagnosis not present

## 2019-08-15 DIAGNOSIS — E559 Vitamin D deficiency, unspecified: Secondary | ICD-10-CM | POA: Diagnosis not present

## 2019-08-15 DIAGNOSIS — N401 Enlarged prostate with lower urinary tract symptoms: Secondary | ICD-10-CM | POA: Diagnosis not present

## 2019-08-15 DIAGNOSIS — R801 Persistent proteinuria, unspecified: Secondary | ICD-10-CM | POA: Diagnosis not present

## 2019-08-15 DIAGNOSIS — E889 Metabolic disorder, unspecified: Secondary | ICD-10-CM | POA: Diagnosis not present

## 2019-08-15 DIAGNOSIS — R338 Other retention of urine: Secondary | ICD-10-CM | POA: Diagnosis not present

## 2019-08-15 DIAGNOSIS — N183 Chronic kidney disease, stage 3 (moderate): Secondary | ICD-10-CM | POA: Diagnosis not present

## 2019-08-15 DIAGNOSIS — M9080 Osteopathy in diseases classified elsewhere, unspecified site: Secondary | ICD-10-CM | POA: Diagnosis not present

## 2019-10-09 DIAGNOSIS — R338 Other retention of urine: Secondary | ICD-10-CM | POA: Diagnosis not present

## 2019-10-09 DIAGNOSIS — N401 Enlarged prostate with lower urinary tract symptoms: Secondary | ICD-10-CM | POA: Diagnosis not present

## 2019-10-09 DIAGNOSIS — R350 Frequency of micturition: Secondary | ICD-10-CM | POA: Diagnosis not present

## 2019-10-17 DIAGNOSIS — I10 Essential (primary) hypertension: Secondary | ICD-10-CM | POA: Diagnosis not present

## 2019-10-17 DIAGNOSIS — M1A30X0 Chronic gout due to renal impairment, unspecified site, without tophus (tophi): Secondary | ICD-10-CM | POA: Diagnosis not present

## 2019-10-17 DIAGNOSIS — I129 Hypertensive chronic kidney disease with stage 1 through stage 4 chronic kidney disease, or unspecified chronic kidney disease: Secondary | ICD-10-CM | POA: Diagnosis not present

## 2019-10-17 DIAGNOSIS — N183 Chronic kidney disease, stage 3 unspecified: Secondary | ICD-10-CM | POA: Diagnosis not present

## 2019-10-17 DIAGNOSIS — R7309 Other abnormal glucose: Secondary | ICD-10-CM | POA: Diagnosis not present

## 2019-10-17 DIAGNOSIS — I700 Atherosclerosis of aorta: Secondary | ICD-10-CM | POA: Diagnosis not present

## 2019-10-17 DIAGNOSIS — E782 Mixed hyperlipidemia: Secondary | ICD-10-CM | POA: Diagnosis not present

## 2019-10-26 DIAGNOSIS — N390 Urinary tract infection, site not specified: Secondary | ICD-10-CM | POA: Diagnosis not present

## 2019-10-26 DIAGNOSIS — R319 Hematuria, unspecified: Secondary | ICD-10-CM | POA: Diagnosis not present

## 2019-10-30 DIAGNOSIS — K219 Gastro-esophageal reflux disease without esophagitis: Secondary | ICD-10-CM | POA: Diagnosis not present

## 2019-10-30 DIAGNOSIS — N453 Epididymo-orchitis: Secondary | ICD-10-CM | POA: Diagnosis not present

## 2019-10-30 DIAGNOSIS — E782 Mixed hyperlipidemia: Secondary | ICD-10-CM | POA: Diagnosis not present

## 2019-10-30 DIAGNOSIS — M1A30X0 Chronic gout due to renal impairment, unspecified site, without tophus (tophi): Secondary | ICD-10-CM | POA: Diagnosis not present

## 2019-10-30 DIAGNOSIS — N401 Enlarged prostate with lower urinary tract symptoms: Secondary | ICD-10-CM | POA: Diagnosis not present

## 2019-10-30 DIAGNOSIS — Z0000 Encounter for general adult medical examination without abnormal findings: Secondary | ICD-10-CM | POA: Diagnosis not present

## 2019-10-30 DIAGNOSIS — N183 Chronic kidney disease, stage 3 unspecified: Secondary | ICD-10-CM | POA: Diagnosis not present

## 2019-10-30 DIAGNOSIS — R338 Other retention of urine: Secondary | ICD-10-CM | POA: Diagnosis not present

## 2019-11-05 DIAGNOSIS — R3914 Feeling of incomplete bladder emptying: Secondary | ICD-10-CM | POA: Diagnosis not present

## 2019-11-05 DIAGNOSIS — N401 Enlarged prostate with lower urinary tract symptoms: Secondary | ICD-10-CM | POA: Diagnosis not present

## 2019-11-05 DIAGNOSIS — N130 Hydronephrosis with ureteropelvic junction obstruction: Secondary | ICD-10-CM | POA: Diagnosis not present

## 2019-11-07 DIAGNOSIS — N453 Epididymo-orchitis: Secondary | ICD-10-CM | POA: Diagnosis not present

## 2019-11-07 DIAGNOSIS — N183 Chronic kidney disease, stage 3 unspecified: Secondary | ICD-10-CM | POA: Diagnosis not present

## 2019-11-09 DIAGNOSIS — N453 Epididymo-orchitis: Secondary | ICD-10-CM | POA: Diagnosis not present

## 2019-11-09 DIAGNOSIS — N401 Enlarged prostate with lower urinary tract symptoms: Secondary | ICD-10-CM | POA: Diagnosis not present

## 2019-11-09 DIAGNOSIS — R338 Other retention of urine: Secondary | ICD-10-CM | POA: Diagnosis not present

## 2019-11-12 DIAGNOSIS — R338 Other retention of urine: Secondary | ICD-10-CM | POA: Diagnosis not present

## 2019-11-12 DIAGNOSIS — N401 Enlarged prostate with lower urinary tract symptoms: Secondary | ICD-10-CM | POA: Diagnosis not present

## 2019-11-12 DIAGNOSIS — N453 Epididymo-orchitis: Secondary | ICD-10-CM | POA: Diagnosis not present

## 2019-11-12 DIAGNOSIS — Z0189 Encounter for other specified special examinations: Secondary | ICD-10-CM | POA: Diagnosis not present

## 2019-11-15 ENCOUNTER — Telehealth: Payer: Self-pay | Admitting: Internal Medicine

## 2019-11-15 DIAGNOSIS — N453 Epididymo-orchitis: Secondary | ICD-10-CM | POA: Diagnosis not present

## 2019-11-15 DIAGNOSIS — N401 Enlarged prostate with lower urinary tract symptoms: Secondary | ICD-10-CM | POA: Diagnosis not present

## 2019-11-15 DIAGNOSIS — R8271 Bacteriuria: Secondary | ICD-10-CM | POA: Diagnosis not present

## 2019-11-15 DIAGNOSIS — R3914 Feeling of incomplete bladder emptying: Secondary | ICD-10-CM | POA: Diagnosis not present

## 2019-11-15 NOTE — Telephone Encounter (Signed)
Patient advised of this information 

## 2019-11-15 NOTE — Telephone Encounter (Signed)
Patient quit seeing you for primary care in 2019 and patient called this morning stating he had a few issues going on and would like to re-establish care with you. Please Advise.

## 2019-11-15 NOTE — Telephone Encounter (Signed)
Unfortunately I have stopped taking new primary care patients.  My panel has filled up.  Certainly Samuel Bouche, DNP or Dr. Luetta Nutting could be options.

## 2019-11-19 DIAGNOSIS — Z87440 Personal history of urinary (tract) infections: Secondary | ICD-10-CM | POA: Diagnosis not present

## 2019-11-19 DIAGNOSIS — N453 Epididymo-orchitis: Secondary | ICD-10-CM | POA: Diagnosis not present

## 2019-11-21 ENCOUNTER — Other Ambulatory Visit: Payer: Self-pay

## 2019-11-27 ENCOUNTER — Ambulatory Visit: Payer: Medicare Other | Admitting: Family Medicine

## 2020-01-11 DIAGNOSIS — Z23 Encounter for immunization: Secondary | ICD-10-CM | POA: Diagnosis not present

## 2020-02-05 DIAGNOSIS — Z23 Encounter for immunization: Secondary | ICD-10-CM | POA: Diagnosis not present

## 2020-02-13 DIAGNOSIS — N644 Mastodynia: Secondary | ICD-10-CM | POA: Diagnosis not present

## 2020-02-22 DIAGNOSIS — N1330 Unspecified hydronephrosis: Secondary | ICD-10-CM | POA: Diagnosis not present

## 2020-02-26 DIAGNOSIS — N644 Mastodynia: Secondary | ICD-10-CM | POA: Diagnosis not present

## 2020-02-26 DIAGNOSIS — N451 Epididymitis: Secondary | ICD-10-CM | POA: Diagnosis not present

## 2020-03-06 DIAGNOSIS — N453 Epididymo-orchitis: Secondary | ICD-10-CM | POA: Diagnosis not present

## 2020-03-10 DIAGNOSIS — T83511D Infection and inflammatory reaction due to indwelling urethral catheter, subsequent encounter: Secondary | ICD-10-CM | POA: Diagnosis not present

## 2020-03-10 DIAGNOSIS — R3914 Feeling of incomplete bladder emptying: Secondary | ICD-10-CM | POA: Diagnosis not present

## 2020-03-10 DIAGNOSIS — N390 Urinary tract infection, site not specified: Secondary | ICD-10-CM | POA: Diagnosis not present

## 2020-03-10 DIAGNOSIS — N453 Epididymo-orchitis: Secondary | ICD-10-CM | POA: Diagnosis not present

## 2020-03-10 DIAGNOSIS — Z789 Other specified health status: Secondary | ICD-10-CM | POA: Diagnosis not present

## 2020-03-12 DIAGNOSIS — N644 Mastodynia: Secondary | ICD-10-CM | POA: Diagnosis not present

## 2020-03-12 DIAGNOSIS — N63 Unspecified lump in unspecified breast: Secondary | ICD-10-CM | POA: Diagnosis not present

## 2020-03-12 DIAGNOSIS — N62 Hypertrophy of breast: Secondary | ICD-10-CM | POA: Diagnosis not present

## 2020-03-12 DIAGNOSIS — R928 Other abnormal and inconclusive findings on diagnostic imaging of breast: Secondary | ICD-10-CM | POA: Diagnosis not present

## 2020-03-26 DIAGNOSIS — N3000 Acute cystitis without hematuria: Secondary | ICD-10-CM | POA: Diagnosis not present

## 2020-03-26 DIAGNOSIS — R3914 Feeling of incomplete bladder emptying: Secondary | ICD-10-CM | POA: Diagnosis not present

## 2020-03-26 DIAGNOSIS — R8271 Bacteriuria: Secondary | ICD-10-CM | POA: Diagnosis not present

## 2020-03-26 DIAGNOSIS — N451 Epididymitis: Secondary | ICD-10-CM | POA: Diagnosis not present

## 2020-04-17 ENCOUNTER — Ambulatory Visit: Payer: Medicare Other | Admitting: Family Medicine

## 2020-05-27 ENCOUNTER — Other Ambulatory Visit: Payer: Self-pay

## 2020-05-27 ENCOUNTER — Encounter: Payer: Self-pay | Admitting: Family Medicine

## 2020-05-27 ENCOUNTER — Ambulatory Visit (INDEPENDENT_AMBULATORY_CARE_PROVIDER_SITE_OTHER): Payer: Medicare Other | Admitting: Family Medicine

## 2020-05-27 VITALS — BP 132/80 | HR 58 | Temp 95.7°F | Ht 72.0 in | Wt 168.1 lb

## 2020-05-27 DIAGNOSIS — R29898 Other symptoms and signs involving the musculoskeletal system: Secondary | ICD-10-CM | POA: Diagnosis not present

## 2020-05-27 DIAGNOSIS — E7849 Other hyperlipidemia: Secondary | ICD-10-CM

## 2020-05-27 DIAGNOSIS — N183 Chronic kidney disease, stage 3 unspecified: Secondary | ICD-10-CM | POA: Diagnosis not present

## 2020-05-27 DIAGNOSIS — M1A9XX0 Chronic gout, unspecified, without tophus (tophi): Secondary | ICD-10-CM | POA: Diagnosis not present

## 2020-05-27 DIAGNOSIS — K219 Gastro-esophageal reflux disease without esophagitis: Secondary | ICD-10-CM | POA: Diagnosis not present

## 2020-05-27 DIAGNOSIS — N1832 Chronic kidney disease, stage 3b: Secondary | ICD-10-CM | POA: Insufficient documentation

## 2020-05-27 MED ORDER — ATORVASTATIN CALCIUM 20 MG PO TABS: 20.0000 mg | ORAL_TABLET | Freq: Every day | ORAL | 3 refills | Status: DC

## 2020-05-27 MED ORDER — FEBUXOSTAT 40 MG PO TABS: 40.0000 mg | ORAL_TABLET | Freq: Every day | ORAL | 3 refills | Status: DC

## 2020-05-27 MED ORDER — OMEPRAZOLE 10 MG PO CPDR: 10.0000 mg | DELAYED_RELEASE_CAPSULE | Freq: Every day | ORAL | 3 refills | Status: DC

## 2020-05-27 NOTE — Patient Instructions (Signed)
I will look into the Uloric, but based on what you told me, I will not likely change anything.  Keep moving.   If you wish to see physical therapy, let me know  Let us know if you need anything.

## 2020-05-27 NOTE — Progress Notes (Signed)
Chief Complaint  Patient presents with  . New Patient (Initial Visit)    Establish care.        New Patient Visit SUBJECTIVE: HPI: Steven Pearson is an 86 y.o.male who is being seen for establishing care.  The patient was previously seen at [facility].  Patient has a history of bladder distention and CKD 3.  He sees both nephrology and urology.  Due to his bladder distention, he was started straight catheterizing himself.  He has had numerous episodes of epididymitis and urinary tract infections over the past 2 years.  He has lost around 20 pounds and is much weaker than before.  He still trying to golf.  Patient has a history of gout.  This affected both MTPs, ankles, and wrists.  He takes Uloric 40 mg daily.  This seems to have worked much better than colchicine and allopurinol.  He was also told it is the best medicine use to preserve the kidneys.  He is aware of the black box warning and cardiovascular adverse effects.  Patient has a history of high cholesterol for which he takes Lipitor 20 mg daily.  He reports compliance and no adverse effects.  Cholesterol is in normal range at last check.  Diet is fair.  He golfs for exercise.  Patient has a history of reflux.  He takes omeprazole 10 mg daily.  No adverse effects and he does report compliance.  This does control his symptoms.  Past Medical History:  Diagnosis Date  . BPH (benign prostatic hypertrophy)   . GERD (gastroesophageal reflux disease)   . HOH (hard of hearing)    both ears  . Hyperlipidemia   . Hypertension   . Normal cardiac stress test 11/28/13   Treadmill  . PVC (premature ventricular contraction)    Past Surgical History:  Procedure Laterality Date  . basal cell skin areas removed     more than 1 removed  . CATARACT EXTRACTION, BILATERAL     left eye poor vision now  . CYSTOSCOPY N/A 03/06/2018   Procedure: CYSTOSCOPY;  Surgeon: Raynelle Bring, MD;  Location: [facility];  Service: Urology;  Laterality: N/A;  . HAND  SURGERY     right  . squamous cell skin pre cancer areas removed     x 1  . TONSILLECTOMY    . TRANSURETHRAL RESECTION OF PROSTATE N/A 03/06/2018   Procedure: TRANSURETHRAL RESECTION OF THE PROSTATE (TURP);  Surgeon: Raynelle Bring, MD;  Location: [facility];  Service: Urology;  Laterality: N/A;   Family History  Problem Relation Age of Onset  . Heart disease Father        Pacemaker   No Known Allergies  Current Outpatient Medications:  .  atorvastatin (LIPITOR) 20 MG tablet, Take 1 tablet (20 mg total) by mouth daily., Disp: 90 tablet, Rfl: 3 .  Cholecalciferol (VITAMIN D-3) 5000 UNITS TABS, Take 5,000 Units by mouth every other day. , Disp: , Rfl:  .  Cyanocobalamin (B-12) 5000 MCG CAPS, Take 5,000 mcg by mouth daily., Disp: , Rfl:  .  D-Mannose 350 MG CAPS, Take by mouth., Disp: , Rfl:  .  NON FORMULARY, Take 120 mLs by mouth daily. Kefir probiotic otc, Disp: , Rfl:  .  omeprazole (PRILOSEC) 10 MG capsule, Take 1 capsule (10 mg total) by mouth daily., Disp: 90 capsule, Rfl: 3 .  Ubiquinol 100 MG CAPS, Take 100 mg by mouth daily. , Disp: , Rfl:  .  VITAMIN K PO, Take 1 tablet by mouth  daily., Disp: , Rfl:  .  febuxostat (ULORIC) 40 MG tablet, Take 1 tablet (40 mg total) by mouth daily., Disp: 90 tablet, Rfl: 3    OBJECTIVE: BP 132/80 (BP Location: Left Arm, Patient Position: Sitting, Cuff Size: Normal)   Pulse (!) 58   Temp (!) 95.7 F (35.4 C) (Temporal)   Ht 6' (1.829 m)   Wt 168 lb 2 oz (76.3 kg)   SpO2 98%   BMI 22.80 kg/m  General:  well developed, well nourished, in no apparent distress Skin:  no significant moles, warts, or growths Nose:  nares patent, septum midline, mucosa normal Throat/Pharynx:  lips and gingiva without lesion; tongue and uvula midline; non-inflamed pharynx; no exudates or postnasal drainage Lungs:  clear to auscultation, breath sounds equal bilaterally, no respiratory distress Cardio:  regular rate and rhythm, no LE edema or bruits Musculoskeletal:   symmetrical muscle groups noted without atrophy or deformity Neuro:  gait cautious Psych: well oriented with normal range of affect and appropriate judgment/insight  ASSESSMENT/PLAN: Chronic gout without tophus, unspecified cause, unspecified site  Other hyperlipidemia - Plan: atorvastatin (LIPITOR) 20 MG tablet, DISCONTINUED: atorvastatin (LIPITOR) 20 MG tablet  Gastroesophageal reflux disease, unspecified whether esophagitis present  Muscular deconditioning  1-shared decision making to continue Uloric.  I do not sacrifice his quality of life with increasing gout flares and changing her back to colchicine which would likely not be affordable or allopurinol which he has not done well with in the past. 2-continue Lipitor 3-continue omeprazole 4-offered physical therapy.  He declined at this time.  I think he will improve as long as he does not get continued UTIs and epididymitis about this. Patient should return in around 5 months for a medical wellness visit. The patient voiced understanding and agreement to the plan.   Livengood, DO 05/27/20  10:00 AM

## 2020-05-28 DIAGNOSIS — N401 Enlarged prostate with lower urinary tract symptoms: Secondary | ICD-10-CM | POA: Diagnosis not present

## 2020-05-28 DIAGNOSIS — R3914 Feeling of incomplete bladder emptying: Secondary | ICD-10-CM | POA: Diagnosis not present

## 2020-05-28 DIAGNOSIS — R8271 Bacteriuria: Secondary | ICD-10-CM | POA: Diagnosis not present

## 2020-05-30 ENCOUNTER — Other Ambulatory Visit: Payer: Self-pay | Admitting: Family Medicine

## 2020-05-30 MED ORDER — ALLOPURINOL 100 MG PO TABS: ORAL_TABLET | ORAL | 2 refills | Status: DC

## 2020-06-12 DIAGNOSIS — R3914 Feeling of incomplete bladder emptying: Secondary | ICD-10-CM | POA: Diagnosis not present

## 2020-07-07 ENCOUNTER — Other Ambulatory Visit: Payer: Self-pay | Admitting: Family Medicine

## 2020-07-07 DIAGNOSIS — I10 Essential (primary) hypertension: Secondary | ICD-10-CM

## 2020-07-07 DIAGNOSIS — N183 Chronic kidney disease, stage 3 unspecified: Secondary | ICD-10-CM

## 2020-07-07 DIAGNOSIS — Z0000 Encounter for general adult medical examination without abnormal findings: Secondary | ICD-10-CM

## 2020-07-07 DIAGNOSIS — M1A9XX0 Chronic gout, unspecified, without tophus (tophi): Secondary | ICD-10-CM

## 2020-07-07 DIAGNOSIS — E7849 Other hyperlipidemia: Secondary | ICD-10-CM

## 2020-07-10 ENCOUNTER — Other Ambulatory Visit (INDEPENDENT_AMBULATORY_CARE_PROVIDER_SITE_OTHER): Payer: Medicare Other

## 2020-07-10 ENCOUNTER — Other Ambulatory Visit: Payer: Self-pay

## 2020-07-10 DIAGNOSIS — Z0000 Encounter for general adult medical examination without abnormal findings: Secondary | ICD-10-CM

## 2020-07-10 DIAGNOSIS — I10 Essential (primary) hypertension: Secondary | ICD-10-CM

## 2020-07-10 DIAGNOSIS — E7849 Other hyperlipidemia: Secondary | ICD-10-CM | POA: Diagnosis not present

## 2020-07-10 DIAGNOSIS — M1A9XX0 Chronic gout, unspecified, without tophus (tophi): Secondary | ICD-10-CM

## 2020-07-10 LAB — CBC WITH DIFFERENTIAL/PLATELET
Basophils Absolute: 0 10*3/uL (ref 0.0–0.1)
Basophils Relative: 0.5 % (ref 0.0–3.0)
Eosinophils Absolute: 0.1 10*3/uL (ref 0.0–0.7)
Eosinophils Relative: 1 % (ref 0.0–5.0)
HCT: 41.6 % (ref 39.0–52.0)
Hemoglobin: 13.9 g/dL (ref 13.0–17.0)
Lymphocytes Relative: 32.8 % (ref 12.0–46.0)
Lymphs Abs: 2 10*3/uL (ref 0.7–4.0)
MCHC: 33.4 g/dL (ref 30.0–36.0)
MCV: 98.2 fl (ref 78.0–100.0)
Monocytes Absolute: 0.4 10*3/uL (ref 0.1–1.0)
Monocytes Relative: 7.1 % (ref 3.0–12.0)
Neutro Abs: 3.5 10*3/uL (ref 1.4–7.7)
Neutrophils Relative %: 58.6 % (ref 43.0–77.0)
Platelets: 156 10*3/uL (ref 150.0–400.0)
RBC: 4.24 Mil/uL (ref 4.22–5.81)
RDW: 13.3 % (ref 11.5–15.5)
WBC: 6 10*3/uL (ref 4.0–10.5)

## 2020-07-10 LAB — LIPID PANEL
Cholesterol: 138 mg/dL (ref 0–200)
HDL: 57.8 mg/dL (ref >39.00)
LDL Cholesterol: 65 mg/dL (ref 0–99)
NonHDL: 80.41
Total CHOL/HDL Ratio: 2
Triglycerides: 79 mg/dL (ref 0.0–149.0)
VLDL: 15.8 mg/dL (ref 0.0–40.0)

## 2020-07-10 LAB — COMPREHENSIVE METABOLIC PANEL
ALT: 10 U/L (ref 0–53)
AST: 14 U/L (ref 0–37)
Albumin: 4.3 g/dL (ref 3.5–5.2)
Alkaline Phosphatase: 49 U/L (ref 39–117)
BUN: 33 mg/dL — ABNORMAL HIGH (ref 6–23)
CO2: 29 mEq/L (ref 19–32)
Calcium: 9.6 mg/dL (ref 8.4–10.5)
Chloride: 100 mEq/L (ref 96–112)
Creatinine, Ser: 1.59 mg/dL — ABNORMAL HIGH (ref 0.40–1.50)
GFR: 41.52 mL/min — ABNORMAL LOW (ref >60.00)
Glucose, Bld: 94 mg/dL (ref 70–99)
Potassium: 4.6 mEq/L (ref 3.5–5.1)
Sodium: 138 mEq/L (ref 135–145)
Total Bilirubin: 0.9 mg/dL (ref 0.2–1.2)
Total Protein: 6.6 g/dL (ref 6.0–8.3)

## 2020-07-10 LAB — TESTOSTERONE: Testosterone: 384.48 ng/dL (ref 300.00–890.00)

## 2020-07-10 LAB — URIC ACID: Uric Acid, Serum: 8.7 mg/dL — ABNORMAL HIGH (ref 4.0–7.8)

## 2020-07-11 ENCOUNTER — Other Ambulatory Visit: Payer: Self-pay | Admitting: Family Medicine

## 2020-07-11 ENCOUNTER — Telehealth: Payer: Self-pay | Admitting: Family Medicine

## 2020-07-11 MED ORDER — ALLOPURINOL 100 MG PO TABS: ORAL_TABLET | ORAL | 2 refills | Status: DC

## 2020-07-11 NOTE — Telephone Encounter (Signed)
Caller: Kendrell Call back  Phone number: [phone]  Patient wants to know if he should increase allopurinol due recent lab results.

## 2020-07-14 ENCOUNTER — Other Ambulatory Visit: Payer: Self-pay | Admitting: Family Medicine

## 2020-07-14 MED ORDER — ALLOPURINOL 100 MG PO TABS: 100.0000 mg | ORAL_TABLET | Freq: Every day | ORAL | 0 refills | Status: DC

## 2020-07-28 ENCOUNTER — Encounter: Payer: Medicare Other | Admitting: Family Medicine

## 2020-07-28 ENCOUNTER — Other Ambulatory Visit: Payer: Self-pay | Admitting: Family Medicine

## 2020-07-28 NOTE — Telephone Encounter (Deleted)
see

## 2020-07-28 NOTE — Telephone Encounter (Signed)
See pharmacy note

## 2020-08-11 ENCOUNTER — Encounter: Payer: Medicare Other | Admitting: Family Medicine

## 2020-08-20 ENCOUNTER — Other Ambulatory Visit: Payer: Self-pay

## 2020-08-20 ENCOUNTER — Encounter: Payer: Self-pay | Admitting: Family Medicine

## 2020-08-20 ENCOUNTER — Ambulatory Visit (INDEPENDENT_AMBULATORY_CARE_PROVIDER_SITE_OTHER): Payer: Medicare Other | Admitting: Family Medicine

## 2020-08-20 VITALS — BP 120/82 | HR 75 | Temp 97.5°F | Ht 73.0 in | Wt 170.1 lb

## 2020-08-20 DIAGNOSIS — M1A9XX0 Chronic gout, unspecified, without tophus (tophi): Secondary | ICD-10-CM | POA: Diagnosis not present

## 2020-08-20 DIAGNOSIS — R7303 Prediabetes: Secondary | ICD-10-CM | POA: Diagnosis not present

## 2020-08-20 NOTE — Progress Notes (Signed)
Subjective:   Steven Pearson is a 86 y.o. male who presents for Medicare Annual/Subsequent preventive examination.  Review of Systems    10 point review of system is negative for new/acute issues.       Objective:    Today's Vitals   08/20/20 0807  BP: 120/82  Pulse: 75  Temp: (!) 97.5 F (36.4 C)  TempSrc: Oral  SpO2: 100%  Weight: 170 lb 2 oz (77.2 kg)  Height: 6\' 1"  (1.854 m)   Body mass index is 22.45 kg/m.  Advanced Directives 08/20/2020 03/01/2018 05/14/2014  Does Patient Have a Medical Advance Directive? Yes Yes Patient has advance directive, copy not in chart;Patient would not like information  Type of Advance Directive [place];Living will [place];Living will -  Does patient want to make changes to medical advance directive? No - Patient declined No - Patient declined -  Copy of [place] in Chart? No - copy requested No - copy requested -    Current Medications (verified) Outpatient Encounter Medications as of 08/20/2020  Medication Sig  . allopurinol (ZYLOPRIM) 100 MG tablet Take 1 tablet (100 mg total) by mouth daily.  Marland Kitchen atorvastatin (LIPITOR) 20 MG tablet Take 1 tablet (20 mg total) by mouth daily.  . Cholecalciferol (VITAMIN D-3) 5000 UNITS TABS Take 5,000 Units by mouth every other day.   . Cyanocobalamin (B-12) 5000 MCG CAPS Take 5,000 mcg by mouth daily.  . D-Mannose 350 MG CAPS Take by mouth.  . NON FORMULARY Take 120 mLs by mouth daily. Kefir probiotic otc  . omeprazole (PRILOSEC) 10 MG capsule Take 1 capsule (10 mg total) by mouth daily.  . Probiotic Product (PROBIOTIC ADVANCED PO) Take 1 capsule by mouth daily.    Allergies (verified) Patient has no known allergies.   History: Past Medical History:  Diagnosis Date  . BPH (benign prostatic hypertrophy)   . GERD (gastroesophageal reflux disease)   . HOH (hard of hearing)    both ears  . Hyperlipidemia   . Hypertension   . Normal cardiac  stress test 11/28/13   Treadmill  . PVC (premature ventricular contraction)    Past Surgical History:  Procedure Laterality Date  . basal cell skin areas removed     more than 1 removed  . CATARACT EXTRACTION, BILATERAL     left eye poor vision now  . CYSTOSCOPY N/A 03/06/2018   Procedure: CYSTOSCOPY;  Surgeon: Raynelle Bring, MD;  Location: [facility];  Service: Urology;  Laterality: N/A;  . HAND SURGERY     right  . squamous cell skin pre cancer areas removed     x 1  . TONSILLECTOMY    . TRANSURETHRAL RESECTION OF PROSTATE N/A 03/06/2018   Procedure: TRANSURETHRAL RESECTION OF THE PROSTATE (TURP);  Surgeon: Raynelle Bring, MD;  Location: [facility];  Service: Urology;  Laterality: N/A;   Family History  Problem Relation Age of Onset  . Heart disease Father        Pacemaker     Tobacco Counseling Counseling given: No, is not a current smoker  Diabetic? No   Activities of Daily Living In your present state of health, do you have any difficulty performing the following activities: 08/20/2020  Hearing? N  Vision? N  Difficulty concentrating or making decisions? N  Walking or climbing stairs? N  Dressing or bathing? N  Doing errands, shopping? N  Some recent data might be hidden    Patient Care Team: [place],  Crosby Oyster, DO as PCP - General (Family Medicine)  Indicate any recent Medical Services you may have received from other than [facility] providers in the past year (date may be approximate).     Assessment:   This is a routine wellness examination for Steven Pearson.  Hearing/Vision screen  Hearing Screening    Visual Acuity Screening   Right eye Left eye Both eyes  Without correction:     With correction: 20/30 20/40 20/30     Dietary issues and exercise activities discussed:    Goals   None    Depression Screen PHQ 2/9 Scores 08/20/2020 10/05/2017 04/14/2016 05/14/2014  PHQ - 2 Score 0 0 0 0    Fall Risk Fall Risk  08/20/2020 10/05/2017 07/25/2017 04/14/2016  05/14/2014  Falls in the past year? 1 No No Yes No  Comment Building up strength again - Emmi Telephone Survey: data to providers prior to load - -    Any stairs in or around the home? Yes  If so, are there any without handrails? No  Home free of loose throw rugs in walkways, pet beds, electrical cords, etc? Yes  Adequate lighting in your home to reduce risk of falls? Yes   ASSISTIVE DEVICES UTILIZED TO PREVENT FALLS:  Life alert? No  Use of a cane, walker or w/c? No  Grab bars in the bathroom? Yes  Shower chair or bench in shower? Yes  Elevated toilet seat or a handicapped toilet? No   TIMED UP AND GO:  Was the test performed? No .    Cognitive Function:     6CIT Screen 10/05/2017  What Year? 0 points  What month? 0 points  What time? 0 points  Count back from 20 0 points  Months in reverse 0 points  Repeat phrase 4 points  Total Score 4    Immunizations Immunization History  Administered Date(s) Administered  . Influenza Split 09/20/2012, 09/24/2013  . Influenza, High Dose Seasonal PF 10/05/2017  . Influenza,inj,Quad PF,6+ Mos 09/24/2016  . Influenza-Unspecified 09/26/2014, 09/27/2015, 09/26/2018  . Moderna SARS-COVID-2 Vaccination 01/11/2020, 02/11/2020  . Pneumococcal Conjugate-13 10/05/2017  . Pneumococcal Polysaccharide-23 09/20/2012    Due, will ck with insurance Flu shot in October Pneumococcal vaccine status: Up to date Covid-19 vaccine status: Completed vaccines  Qualifies for Shingles Vaccine? Yes   Shingrix Completed?: No.    Education has been provided regarding the importance of this vaccine. Patient has been advised to call insurance company to determine out of pocket expense if they have not yet received this vaccine. Advised may also receive vaccine at local pharmacy or Health Dept. Verbalized acceptance and understanding.  Screening Tests Health Maintenance  Topic Date Due  . INFLUENZA VACCINE  07/27/2020  . TETANUS/TDAP  08/20/2024  (Originally 11/26/2019)  . COVID-19 Vaccine  Completed  . PNA vac Low Risk Adult  Completed    Health Maintenance  Health Maintenance Due  Topic Date Due  . INFLUENZA VACCINE  07/27/2020    Colorectal cancer screening: No longer required.   Lung Cancer Screening: (Low Dose CT Chest recommended if Age 47-80 years, 30 pack-year currently smoking OR have quit w/in 15years.) does not qualify.   Lung Cancer Screening Referral: N/A  Additional Screening:  Hepatitis C Screening: does not qualify;  Vision Screening: Recommended annual ophthalmology exams for early detection of glaucoma and other disorders of the eye. Is the patient up to date with their annual eye exam?  Yes  Who is the provider or what is the name  of the office in which the patient attends annual eye exams? Dr. Amalia Hailey If pt is not established with a provider, would they like to be referred to a provider to establish care? N/A.   Dental Screening: Recommended annual dental exams for proper oral hygiene  Community Resource Referral / Chronic Care Management: CRR required this visit?  No   CCM required this visit?  No      Plan:     I have personally reviewed and noted the following in the patient's chart:   . Medical and social history . Use of alcohol, tobacco or illicit drugs  . Current medications and supplements . Functional ability and status . Nutritional status . Physical activity . Advanced directives . List of other physicians . Hospitalizations, surgeries, and ER visits in previous 12 months . Vitals . Screenings to include cognitive, depression, and falls . Referrals and appointments  In addition, I have reviewed and discussed with patient certain preventive protocols, quality metrics, and best practice recommendations. A written personalized care plan for preventive services as well as general preventive health recommendations were provided to patient.     E. Lopez,  DO   08/20/2020

## 2020-08-20 NOTE — Patient Instructions (Addendum)
The new Shingrix vaccine (for shingles) is a 2 shot series. It can make people feel low energy, achy and almost like they have the flu for 48 hours after injection. Please plan accordingly when deciding on when to get this shot. Call our office for a nurse visit appointment to get this. The second shot of the series is less severe regarding the side effects, but it still lasts 48 hours.   I recommend getting the flu shot in mid October. This suggestion would change if the CDC comes out with a different recommendation.   Give [place] 2-3 business days to get the results of your labs back.   Keep the diet clean and stay active.  Let me know if you are interested in physical therapy.  Let us know if you need anything.

## 2020-08-21 LAB — HEMOGLOBIN A1C
Hgb A1c MFr Bld: 5.4 % of total Hgb (ref <5.7)
Mean Plasma Glucose: 108 (calc)
eAG (mmol/L): 6 (calc)

## 2020-08-21 LAB — URIC ACID: Uric Acid, Serum: 6.5 mg/dL (ref 4.0–8.0)

## 2020-08-22 DIAGNOSIS — I129 Hypertensive chronic kidney disease with stage 1 through stage 4 chronic kidney disease, or unspecified chronic kidney disease: Secondary | ICD-10-CM | POA: Diagnosis not present

## 2020-08-22 DIAGNOSIS — R338 Other retention of urine: Secondary | ICD-10-CM | POA: Diagnosis not present

## 2020-08-22 DIAGNOSIS — N401 Enlarged prostate with lower urinary tract symptoms: Secondary | ICD-10-CM | POA: Diagnosis not present

## 2020-09-24 ENCOUNTER — Other Ambulatory Visit: Payer: Self-pay | Admitting: Family Medicine

## 2020-09-24 MED ORDER — ALLOPURINOL 100 MG PO TABS: 100.0000 mg | ORAL_TABLET | Freq: Every day | ORAL | 0 refills | Status: DC

## 2020-10-07 DIAGNOSIS — R338 Other retention of urine: Secondary | ICD-10-CM | POA: Diagnosis not present

## 2020-10-07 DIAGNOSIS — N401 Enlarged prostate with lower urinary tract symptoms: Secondary | ICD-10-CM | POA: Diagnosis not present

## 2020-10-14 DIAGNOSIS — R8271 Bacteriuria: Secondary | ICD-10-CM | POA: Diagnosis not present

## 2020-10-14 DIAGNOSIS — K5730 Diverticulosis of large intestine without perforation or abscess without bleeding: Secondary | ICD-10-CM | POA: Diagnosis not present

## 2020-10-14 DIAGNOSIS — R338 Other retention of urine: Secondary | ICD-10-CM | POA: Diagnosis not present

## 2020-10-14 DIAGNOSIS — N401 Enlarged prostate with lower urinary tract symptoms: Secondary | ICD-10-CM | POA: Diagnosis not present

## 2020-10-14 DIAGNOSIS — N390 Urinary tract infection, site not specified: Secondary | ICD-10-CM | POA: Diagnosis not present

## 2020-11-05 ENCOUNTER — Encounter: Payer: Medicare Other | Admitting: Family Medicine

## 2020-11-14 ENCOUNTER — Other Ambulatory Visit: Payer: Self-pay

## 2020-11-14 ENCOUNTER — Encounter: Payer: Self-pay | Admitting: Family Medicine

## 2020-11-14 ENCOUNTER — Ambulatory Visit (INDEPENDENT_AMBULATORY_CARE_PROVIDER_SITE_OTHER): Payer: Medicare Other | Admitting: Family Medicine

## 2020-11-14 VITALS — BP 142/86 | HR 78 | Temp 97.0°F | Ht 73.0 in | Wt 170.1 lb

## 2020-11-14 DIAGNOSIS — N400 Enlarged prostate without lower urinary tract symptoms: Secondary | ICD-10-CM

## 2020-11-14 DIAGNOSIS — I714 Abdominal aortic aneurysm, without rupture, unspecified: Secondary | ICD-10-CM | POA: Insufficient documentation

## 2020-11-14 DIAGNOSIS — I700 Atherosclerosis of aorta: Secondary | ICD-10-CM | POA: Insufficient documentation

## 2020-11-14 MED ORDER — ATORVASTATIN CALCIUM 40 MG PO TABS: 40.0000 mg | ORAL_TABLET | Freq: Every day | ORAL | 3 refills | Status: DC

## 2020-11-14 NOTE — Patient Instructions (Signed)
Keep the diet clean and stay active.  Take 2 tabs of your Lipitor daily until the new dosage.  We will place a reminder regarding the follow up ultrasound.  Let us know if you need anything.

## 2020-11-14 NOTE — Progress Notes (Signed)
Chief Complaint  Patient presents with  . Results    Subjective: Patient is a 86 y.o. male here for f/u imaging results.  The patient recently had a CT abdomen/pelvis done by the urology team.  He is here to go over some of those results.  It did show an enlarged prostate which she is aware of.  His urology team is managing this.  It also showed aortic atherosclerosis.  He is taking Lipitor 20 mg daily.  He is a former smoker.  He denies any chest pain or shortness of breath.  The scan revealed a small enlargement of the infrarenal abdominal aortic aneurysm from 3.4 cm to 3.8 cm.  He denies any pulsation or pain in the abdomen.  Past Medical History:  Diagnosis Date  . BPH (benign prostatic hypertrophy)   . GERD (gastroesophageal reflux disease)   . HOH (hard of hearing)    both ears  . Hyperlipidemia   . Hypertension   . Normal cardiac stress test 11/28/13   Treadmill  . PVC (premature ventricular contraction)     Objective: BP (!) 142/86 (BP Location: Left Arm, Patient Position: Sitting, Cuff Size: Normal)   Pulse 78   Temp (!) 97 F (36.1 C) (Oral)   Ht 6\' 1"  (1.854 m)   Wt 170 lb 2 oz (77.2 kg)   SpO2 98%   BMI 22.45 kg/m  General: Awake, appears stated age Heart: RRR, no lower extremity edema Lungs: CTAB, no rales, wheezes or rhonchi. No accessory muscle use Psych: Age appropriate judgment and insight, normal affect and mood  Assessment and Plan: Enlarged prostate  Aortic atherosclerosis (HCC) - Plan: atorvastatin (LIPITOR) 40 MG tablet, Comprehensive metabolic panel, Lipid panel  Abdominal aortic aneurysm (AAA) without rupture ([place])  1.  No changes, monitored by urology. 2.  Increase Lipitor from 20 mg daily to 20 mg daily.  Check above labs in 6 weeks. 3.  We will place a recall letter to recheck ultrasound in 2 years. I will see him as originally scheduled. The patient voiced understanding and agreement to the plan.  [place], DO 11/14/20   10:21 AM

## 2020-11-15 ENCOUNTER — Other Ambulatory Visit: Payer: Self-pay | Admitting: Family Medicine

## 2020-11-18 DIAGNOSIS — R8271 Bacteriuria: Secondary | ICD-10-CM | POA: Diagnosis not present

## 2020-12-03 DIAGNOSIS — N401 Enlarged prostate with lower urinary tract symptoms: Secondary | ICD-10-CM | POA: Diagnosis not present

## 2020-12-03 DIAGNOSIS — R338 Other retention of urine: Secondary | ICD-10-CM | POA: Diagnosis not present

## 2020-12-11 DIAGNOSIS — R8271 Bacteriuria: Secondary | ICD-10-CM | POA: Diagnosis not present

## 2020-12-11 DIAGNOSIS — N390 Urinary tract infection, site not specified: Secondary | ICD-10-CM | POA: Diagnosis not present

## 2020-12-11 DIAGNOSIS — B9620 Unspecified Escherichia coli [E. coli] as the cause of diseases classified elsewhere: Secondary | ICD-10-CM | POA: Diagnosis not present

## 2020-12-24 DIAGNOSIS — M1000 Idiopathic gout, unspecified site: Secondary | ICD-10-CM

## 2020-12-25 ENCOUNTER — Other Ambulatory Visit (INDEPENDENT_AMBULATORY_CARE_PROVIDER_SITE_OTHER): Payer: Medicare Other

## 2020-12-25 ENCOUNTER — Other Ambulatory Visit: Payer: Self-pay

## 2020-12-25 DIAGNOSIS — M1000 Idiopathic gout, unspecified site: Secondary | ICD-10-CM

## 2020-12-25 DIAGNOSIS — I700 Atherosclerosis of aorta: Secondary | ICD-10-CM

## 2020-12-25 LAB — COMPREHENSIVE METABOLIC PANEL
ALT: 11 U/L (ref 0–53)
AST: 17 U/L (ref 0–37)
Albumin: 4.2 g/dL (ref 3.5–5.2)
Alkaline Phosphatase: 47 U/L (ref 39–117)
BUN: 25 mg/dL — ABNORMAL HIGH (ref 6–23)
CO2: 31 mEq/L (ref 19–32)
Calcium: 9.3 mg/dL (ref 8.4–10.5)
Chloride: 101 mEq/L (ref 96–112)
Creatinine, Ser: 1.54 mg/dL — ABNORMAL HIGH (ref 0.40–1.50)
GFR: 40.68 mL/min — ABNORMAL LOW (ref >60.00)
Glucose, Bld: 101 mg/dL — ABNORMAL HIGH (ref 70–99)
Potassium: 4.5 mEq/L (ref 3.5–5.1)
Sodium: 137 mEq/L (ref 135–145)
Total Bilirubin: 1 mg/dL (ref 0.2–1.2)
Total Protein: 6.5 g/dL (ref 6.0–8.3)

## 2020-12-25 LAB — LIPID PANEL
Cholesterol: 138 mg/dL (ref 0–200)
HDL: 58.6 mg/dL (ref >39.00)
LDL Cholesterol: 62 mg/dL (ref 0–99)
NonHDL: 79.77
Total CHOL/HDL Ratio: 2
Triglycerides: 89 mg/dL (ref 0.0–149.0)
VLDL: 17.8 mg/dL (ref 0.0–40.0)

## 2020-12-25 LAB — URIC ACID: Uric Acid, Serum: 5.9 mg/dL (ref 4.0–7.8)

## 2021-01-01 DIAGNOSIS — I493 Ventricular premature depolarization: Secondary | ICD-10-CM | POA: Diagnosis not present

## 2021-01-01 DIAGNOSIS — I498 Other specified cardiac arrhythmias: Secondary | ICD-10-CM | POA: Diagnosis not present

## 2021-01-01 DIAGNOSIS — N401 Enlarged prostate with lower urinary tract symptoms: Secondary | ICD-10-CM | POA: Diagnosis not present

## 2021-01-01 DIAGNOSIS — Z01810 Encounter for preprocedural cardiovascular examination: Secondary | ICD-10-CM | POA: Diagnosis not present

## 2021-01-06 DIAGNOSIS — Z9889 Other specified postprocedural states: Secondary | ICD-10-CM | POA: Diagnosis not present

## 2021-01-06 DIAGNOSIS — N400 Enlarged prostate without lower urinary tract symptoms: Secondary | ICD-10-CM | POA: Diagnosis not present

## 2021-01-06 DIAGNOSIS — Z87440 Personal history of urinary (tract) infections: Secondary | ICD-10-CM | POA: Diagnosis not present

## 2021-01-06 DIAGNOSIS — N401 Enlarged prostate with lower urinary tract symptoms: Secondary | ICD-10-CM | POA: Diagnosis not present

## 2021-01-06 DIAGNOSIS — N138 Other obstructive and reflux uropathy: Secondary | ICD-10-CM | POA: Diagnosis not present

## 2021-01-06 DIAGNOSIS — N390 Urinary tract infection, site not specified: Secondary | ICD-10-CM | POA: Diagnosis not present

## 2021-01-06 DIAGNOSIS — R338 Other retention of urine: Secondary | ICD-10-CM | POA: Diagnosis not present

## 2021-01-06 HISTORY — PX: TRANSURETHRAL RESECTION OF PROSTATE: SHX73

## 2021-01-17 ENCOUNTER — Other Ambulatory Visit: Payer: Self-pay | Admitting: Family Medicine

## 2021-01-20 DIAGNOSIS — N401 Enlarged prostate with lower urinary tract symptoms: Secondary | ICD-10-CM | POA: Diagnosis not present

## 2021-01-20 DIAGNOSIS — R338 Other retention of urine: Secondary | ICD-10-CM | POA: Diagnosis not present

## 2021-01-22 MED ORDER — ALLOPURINOL 100 MG PO TABS
100.0000 mg | ORAL_TABLET | Freq: Every day | ORAL | 3 refills | Status: DC
Start: 2021-01-22 — End: 2021-11-10

## 2021-01-26 ENCOUNTER — Other Ambulatory Visit: Payer: Self-pay | Admitting: Family Medicine

## 2021-02-25 ENCOUNTER — Other Ambulatory Visit: Payer: Self-pay

## 2021-02-25 ENCOUNTER — Ambulatory Visit (INDEPENDENT_AMBULATORY_CARE_PROVIDER_SITE_OTHER): Payer: Medicare Other | Admitting: Family Medicine

## 2021-02-25 ENCOUNTER — Encounter: Payer: Self-pay | Admitting: Family Medicine

## 2021-02-25 VITALS — BP 152/84 | HR 65 | Temp 97.9°F | Ht 73.0 in | Wt 170.2 lb

## 2021-02-25 DIAGNOSIS — M1A09X0 Idiopathic chronic gout, multiple sites, without tophus (tophi): Secondary | ICD-10-CM | POA: Insufficient documentation

## 2021-02-25 DIAGNOSIS — E785 Hyperlipidemia, unspecified: Secondary | ICD-10-CM

## 2021-02-25 LAB — COMPREHENSIVE METABOLIC PANEL
ALT: 7 U/L (ref 0–53)
AST: 12 U/L (ref 0–37)
Albumin: 3.8 g/dL (ref 3.5–5.2)
Alkaline Phosphatase: 66 U/L (ref 39–117)
BUN: 27 mg/dL — ABNORMAL HIGH (ref 6–23)
CO2: 30 mEq/L (ref 19–32)
Calcium: 9.4 mg/dL (ref 8.4–10.5)
Chloride: 101 mEq/L (ref 96–112)
Creatinine, Ser: 1.52 mg/dL — ABNORMAL HIGH (ref 0.40–1.50)
GFR: 41.28 mL/min — ABNORMAL LOW (ref >60.00)
Glucose, Bld: 93 mg/dL (ref 70–99)
Potassium: 4.3 mEq/L (ref 3.5–5.1)
Sodium: 137 mEq/L (ref 135–145)
Total Bilirubin: 0.7 mg/dL (ref 0.2–1.2)
Total Protein: 6.6 g/dL (ref 6.0–8.3)

## 2021-02-25 LAB — LIPID PANEL
Cholesterol: 122 mg/dL (ref 0–200)
HDL: 59.1 mg/dL (ref >39.00)
LDL Cholesterol: 48 mg/dL (ref 0–99)
NonHDL: 62.62
Total CHOL/HDL Ratio: 2
Triglycerides: 75 mg/dL (ref 0.0–149.0)
VLDL: 15 mg/dL (ref 0.0–40.0)

## 2021-02-25 LAB — URIC ACID: Uric Acid, Serum: 5.5 mg/dL (ref 4.0–7.8)

## 2021-02-25 NOTE — Patient Instructions (Signed)
Give us 2-3 business days to get the results of your labs back.   Keep the diet clean and stay active.  Let us know if you need anything. 

## 2021-02-25 NOTE — Progress Notes (Signed)
Chief Complaint  Patient presents with  . Follow-up    Steven Pearson is a 87 y.o. male here for gout.  Currently being treated with allopurinol 100 mg/d. The joint(s) affected include: ankles, wrists, 1st MTP Most recent uric acid level is: 5.9 Reports compliance. Side effects of medications: None Is avoiding seafood, sweet/sugary beverages, alcohol, and red meats.  Hyperlipidemia Patient presents for dyslipidemia follow up. Currently being treated with Lipitor 40 mg/d and compliance with treatment thus far has been good. He denies myalgias. He is adhering to a healthy diet. Exercise: walking and yoga The patient is not known to have coexisting coronary artery disease.   Past Medical History:  Diagnosis Date  . BPH (benign prostatic hypertrophy)   . GERD (gastroesophageal reflux disease)   . HOH (hard of hearing)    both ears  . Hyperlipidemia   . Hypertension   . Normal cardiac stress test 11/28/13   Treadmill  . PVC (premature ventricular contraction)     BP (!) 152/84 (BP Location: Left Arm, Patient Position: Sitting, Cuff Size: Normal)   Pulse 65   Temp 97.9 F (36.6 C) (Oral)   Ht 6\' 1"  (1.854 m)   Wt 170 lb 4 oz (77.2 kg)   SpO2 97%   BMI 22.46 kg/m  Gen: Awake, alert, appears stated age Neck: No masses or asymmetry Heart: RRR, no murmurs, no bruits, no LE edema Lungs: CTAB, no accessory muscle use MSK: no swelling or TTP, normal gait Skin: No erythema or warmth Psych: Age appropriate judgment and insight, nml mood and affect  Chronic gout of multiple sites, unspecified cause - Plan: Uric acid  Hyperlipidemia, unspecified hyperlipidemia type - Plan: Lipid panel, Comprehensive metabolic panel  1. Ck urate. Will continue allopurinol 100 mg/d. Reminded to avoid foods like alcohol, sweet beverages, red meat, lunch meat, sea food. 2. Cont Lipitor 40 mg/d. Ck above labs.  F/u in 6 mo. The patient voiced understanding and agreement to the plan.  [place], DO 02/25/21 10:11 AM

## 2021-03-17 DIAGNOSIS — R338 Other retention of urine: Secondary | ICD-10-CM | POA: Diagnosis not present

## 2021-03-17 DIAGNOSIS — N401 Enlarged prostate with lower urinary tract symptoms: Secondary | ICD-10-CM | POA: Diagnosis not present

## 2021-03-28 ENCOUNTER — Other Ambulatory Visit: Payer: Self-pay

## 2021-03-28 ENCOUNTER — Emergency Department (INDEPENDENT_AMBULATORY_CARE_PROVIDER_SITE_OTHER)
Admission: RE | Admit: 2021-03-28 | Discharge: 2021-03-28 | Disposition: A | Discharge status: Home / Self Care | Payer: Medicare Other | Source: Ambulatory Visit

## 2021-03-28 ENCOUNTER — Emergency Department (INDEPENDENT_AMBULATORY_CARE_PROVIDER_SITE_OTHER): Payer: Medicare Other

## 2021-03-28 ENCOUNTER — Telehealth: Payer: Self-pay | Admitting: Emergency Medicine

## 2021-03-28 VITALS — BP 170/90 | HR 65 | Temp 98.0°F | Resp 20 | Ht 73.0 in | Wt 160.0 lb

## 2021-03-28 DIAGNOSIS — H5462 Unqualified visual loss, left eye, normal vision right eye: Secondary | ICD-10-CM | POA: Diagnosis not present

## 2021-03-28 DIAGNOSIS — H53132 Sudden visual loss, left eye: Secondary | ICD-10-CM | POA: Diagnosis not present

## 2021-03-28 DIAGNOSIS — I6782 Cerebral ischemia: Secondary | ICD-10-CM

## 2021-03-28 NOTE — ED Provider Notes (Signed)
[facility]    CSN: [id number] Arrival date & time: 03/28/21  0846      History   Chief Complaint Chief Complaint  Patient presents with  . Eye Problem    Left    HPI Steven Pearson is a 87 y.o. male.   Reports episode of left eye vision loss this morning. Reports hx ocular migraines and a "bad" cataract surgery. Reports that with his ocular migraines, his vision gets "wavy" and he may lose sight for a few moments. Reports that this episode was different in that he lost his vision in the L eye and it seemed like a curtain was being pulled down over his L eye. Reports that his vision returned about 30 seconds later. Reports that his normal BP at home is around high 120s/70s. States that his BP has not been this high before. Denies personal hx CVA, TIA, MI. Does have history of HTN, AAA, stage 3 kidney disease. Denies current headache, vision changes, changes in sensation or strength in the face and extremities, dizziness, fatigue, changes in taste or smell, other symptoms.  ROS per HPI  The history is provided by the patient.  Eye Problem   Past Medical History:  Diagnosis Date  . BPH (benign prostatic hypertrophy)   . GERD (gastroesophageal reflux disease)   . HOH (hard of hearing)    both ears  . Hyperlipidemia   . Hypertension   . Normal cardiac stress test 11/28/13   Treadmill  . PVC (premature ventricular contraction)     Patient Active Problem List   Diagnosis Date Noted  . Chronic gout of multiple sites 02/25/2021  . Aortic atherosclerosis ([place]) 11/14/2020  . Abdominal aortic aneurysm (AAA) without rupture ([place]) 11/14/2020  . Muscular deconditioning 05/27/2020  . Stage 3 chronic kidney disease ([place]) 05/27/2020  . Bladder outlet obstruction 03/06/2018  . Hydronephrosis of left kidney 12/13/2017  . Diverticulosis 11/14/2017  . Essential hypertension, benign 05/14/2016  . Proteinuria 04/15/2016  . Sinus bradycardia 04/14/2016  . Bilateral lower  extremity edema 05/14/2014  . Chest pain 10/24/2013  . PVC's (premature ventricular contractions) 10/08/2013  . Stool mucus and excess flatulence. 08/16/2012  . Annual physical exam 08/16/2012  . Gout 08/16/2012  . Enlarged prostate   . Gastroesophageal reflux disease   . Hyperlipidemia     Past Surgical History:  Procedure Laterality Date  . basal cell skin areas removed     more than 1 removed  . CATARACT EXTRACTION, BILATERAL     left eye poor vision now  . CYSTOSCOPY N/A 03/06/2018   Procedure: CYSTOSCOPY;  Surgeon: Raynelle Bring, MD;  Location: [facility];  Service: Urology;  Laterality: N/A;  . HAND SURGERY     right  . squamous cell skin pre cancer areas removed     x 1  . TONSILLECTOMY    . TRANSURETHRAL RESECTION OF PROSTATE N/A 03/06/2018   Procedure: TRANSURETHRAL RESECTION OF THE PROSTATE (TURP);  Surgeon: Raynelle Bring, MD;  Location: [facility];  Service: Urology;  Laterality: N/A;  . TRANSURETHRAL RESECTION OF PROSTATE  01/06/2021       Home Medications    Prior to Admission medications   Medication Sig Start Date End Date Taking? Authorizing Provider  Ubiquinol 100 MG CAPS Take by mouth.   Yes [provider]  allopurinol (ZYLOPRIM) 100 MG tablet Take 1 tablet (100 mg total) by mouth daily. 01/22/21   Shelda Pal, DO  atorvastatin (LIPITOR) 40 MG tablet Take 1 tablet (  40 mg total) by mouth daily. 11/14/20   Shelda Pal, DO  Cholecalciferol (VITAMIN D-3) 5000 UNITS TABS Take 5,000 Units by mouth every other day.     [provider]  Cyanocobalamin (B-12) 5000 MCG CAPS Take 5,000 mcg by mouth daily.    [provider]  D-Mannose 350 MG CAPS Take by mouth.    [provider]  NON FORMULARY Take 120 mLs by mouth daily. Kefir probiotic otc    [provider]  omeprazole (PRILOSEC) 10 MG capsule TAKE 1 CAPSULE DAILY 01/26/21   Shelda Pal, DO  Probiotic Product (PROBIOTIC ADVANCED PO) Take 1  capsule by mouth daily.    [provider]    Family History Family History  Problem Relation Age of Onset  . Heart Problems Mother   . Heart disease Father        Pacemaker    Social History Social History   Tobacco Use  . Smoking status: Former Smoker    Packs/day: 1.50    Years: 45.00    Pack years: 67.50    Types: Cigarettes    Quit date: 08/17/1999    Years since quitting: 21.6  . Smokeless tobacco: Never Used  Vaping Use  . Vaping Use: Never used  Substance Use Topics  . Alcohol use: Yes    Alcohol/week: 5.0 standard drinks    Types: 5 Glasses of wine per week    Comment: glass of wine per day  . Drug use: No     Allergies   Nitrofuran derivatives   Review of Systems Review of Systems   Physical Exam Triage Vital Signs ED Triage Vitals  Enc Vitals Group     BP 03/28/21 0900 (!) 175/106     Pulse Rate 03/28/21 0900 65     Resp 03/28/21 0900 20     Temp 03/28/21 0907 98 F (36.7 C)     Temp src --      SpO2 03/28/21 0900 99 %     Weight 03/28/21 0859 160 lb (72.6 kg)     Height 03/28/21 0859 6\' 1"  (1.854 m)     Head Circumference --      Peak Flow --      Pain Score 03/28/21 0859 0     Pain Loc --      Pain Edu? --      Excl. in [place]? --    No data found.  Updated Vital Signs BP (!) 170/90   Pulse 65   Temp 98 F (36.7 C)   Resp 20   Ht 6\' 1"  (1.854 m)   Wt 160 lb (72.6 kg)   SpO2 99%   BMI 21.11 kg/m   Visual Acuity Right Eye Distance: 20/40 Left Eye Distance: 20/25 Bilateral Distance: 20/40  Physical Exam Vitals and nursing note reviewed.  Constitutional:      Appearance: Normal appearance. He is well-developed.  HENT:     Head: Normocephalic and atraumatic.     Nose: Nose normal.     Mouth/Throat:     Mouth: Mucous membranes are moist.     Pharynx: Oropharynx is clear.  Eyes:     Extraocular Movements: Extraocular movements intact.     Conjunctiva/sclera: Conjunctivae normal.     Comments: L pupil irregularly  shaped, sluggish response to light  Cardiovascular:     Rate and Rhythm: Normal rate and regular rhythm.     Heart sounds: Normal heart sounds. No murmur heard.  Pulmonary:     Effort: Pulmonary effort is normal. No respiratory distress.     Breath sounds: Normal breath sounds.  Abdominal:     General: Bowel sounds are normal.     Palpations: Abdomen is soft.     Tenderness: There is no abdominal tenderness.  Musculoskeletal:        General: Normal range of motion.     Cervical back: Neck supple.  Skin:    General: Skin is warm and dry.     Capillary Refill: Capillary refill takes less than 2 seconds.  Neurological:     General: No focal deficit present.     Mental Status: He is alert and oriented to person, place, and time.  Psychiatric:        Mood and Affect: Mood normal.        Behavior: Behavior normal.        Thought Content: Thought content normal.      UC Treatments / Results  Labs (all labs ordered are listed, but only abnormal results are displayed) Labs Reviewed - No data to display  EKG   Radiology CT Head Wo Contrast  Result Date: 03/28/2021 CLINICAL DATA:  Temporary left-sided visual loss this morning. EXAM: CT HEAD WITHOUT CONTRAST TECHNIQUE: Contiguous axial images were obtained from the base of the skull through the vertex without intravenous contrast. COMPARISON:  None. FINDINGS: Brain: Ventricles, cisterns and other CSF spaces are normal. There is mild chronic ischemic microvascular disease present. There is no mass, mass effect, shift of midline structures or acute hemorrhage. No evidence of acute infarction. Vascular: No hyperdense vessel or unexpected calcification. Skull: Normal. Negative for fracture or focal lesion. Sinuses/Orbits: No acute finding. Other: None. IMPRESSION: 1. No acute findings. 2. Mild chronic ischemic microvascular disease. Electronically Signed   By: Marin Olp M.D.   On: 03/28/2021 09:45    Procedures Procedures (including  critical care time)  Medications Ordered in UC Medications - No data to display  Initial Impression / Assessment and Plan / UC Course  I have reviewed the triage vital signs and the nursing notes.  Pertinent labs & imaging results that were available during my care of the patient were reviewed by me and considered in my medical decision making (see chart for details).     Sudden Vision Loss Ischemic changes on head CT  Discussed with patient that given his symptoms and elevated BP that he would be best served in the ER for further work up Declines at this time States that he will wait and talk to his PCP on Monday Did CT in office today since he declined going to the ER CT shows chronic microvascular ischemic changes Discussed that he could have had TIA this morning and that a CVA could be next Continues to decline ER and further workup Stable at discharge    Final Clinical Impressions(s) / UC Diagnoses   Final diagnoses:  Sudden visual loss of left eye  Ischemic changes on head CT     Discharge Instructions     Your CT shows mild chronic ischemic microvascular, no acute ocular abnormalities  Follow up with your PCP on Monday  If symptoms recur or worsen, follow up immediately in the ER    ED Prescriptions    None     PDMP not reviewed this encounter.   Faustino Congress, NP 03/29/21 [phone]

## 2021-03-28 NOTE — Telephone Encounter (Signed)
Patient called regarding his elevated BP here in the clinic and at home.  Patient states that he has been monitoring his blood pressure since being home and he is concerned about this.  He feels that he should be started on a blood pressure medication.  After speaking with Faustino Congress, she is not comfortable starting patient on any blood pressure medications without bloodwork.  Patient was instructed to either go to the ED or contact his PCP for this.  Patient worried about waiting to see PCP and will contact the on call physician for his PCP.  Patient did state that he has a history of CKD and sees a nephrologists.  Explained that this needs to be monitored by his PCP.  Patient voices understanding.

## 2021-03-28 NOTE — ED Notes (Signed)
Kirkland Hun, NP aware of pt's s/s and BP.

## 2021-03-28 NOTE — Discharge Instructions (Signed)
Your CT shows mild chronic ischemic microvascular, no acute ocular abnormalities  Follow up with your PCP on Monday  If symptoms recur or worsen, follow up immediately in the ER

## 2021-03-28 NOTE — ED Triage Notes (Signed)
Pt presents to Urgent Care with c/o temporary loss of vision in his L eye this morning. He reports that he was at his computer and "saw something like a curtain coming down" in his left eye and then went blind in that eye for 30 seconds. Denies HA, dizziness, or motor problems. He's a/o x 4. Reports that he has occasional ocular migraines.

## 2021-03-30 ENCOUNTER — Other Ambulatory Visit: Payer: Self-pay

## 2021-03-30 ENCOUNTER — Ambulatory Visit (INDEPENDENT_AMBULATORY_CARE_PROVIDER_SITE_OTHER): Payer: Medicare Other | Admitting: Family Medicine

## 2021-03-30 ENCOUNTER — Telehealth: Payer: Self-pay | Admitting: *Deleted

## 2021-03-30 ENCOUNTER — Encounter: Payer: Self-pay | Admitting: Family Medicine

## 2021-03-30 VITALS — BP 142/78 | HR 45 | Temp 98.6°F | Ht 73.0 in | Wt 178.1 lb

## 2021-03-30 DIAGNOSIS — I10 Essential (primary) hypertension: Secondary | ICD-10-CM | POA: Diagnosis not present

## 2021-03-30 MED ORDER — HYDROCHLOROTHIAZIDE 12.5 MG PO TABS: 12.5000 mg | ORAL_TABLET | Freq: Every day | ORAL | 2 refills | Status: DC

## 2021-03-30 NOTE — Patient Instructions (Addendum)
Keep the diet clean and stay active.  Check your blood pressures 2-3 times per week, alternating the time of day you check it. If it is high, considering waiting 1-2 minutes and rechecking. If it gets higher, your anxiety is likely creeping up and we should avoid rechecking.   Let me know on MyChart whether or not we need to start the medication for blood pressure.   Let us know if you need anything.

## 2021-03-30 NOTE — Progress Notes (Signed)
Chief Complaint  Patient presents with  . Hypertension    Subjective Steven Pearson is a 87 y.o. male who presents for hypertension follow up. He does monitor home blood pressures. Blood pressures ranging from 160-170's/90-100's on average over the past 2 d. He was diet controlled prior to the elevated readings.  He is adhering to a healthy diet overall. Current exercise: walking No CP or SOB.    Past Medical History:  Diagnosis Date  . BPH (benign prostatic hypertrophy)   . GERD (gastroesophageal reflux disease)   . HOH (hard of hearing)    both ears  . Hyperlipidemia   . Hypertension   . Normal cardiac stress test 11/28/13   Treadmill  . PVC (premature ventricular contraction)     Exam BP (!) 142/78 (BP Location: Left Arm, Patient Position: Sitting, Cuff Size: Normal)   Pulse (!) 45   Temp 98.6 F (37 C) (Oral)   Ht 6\' 1"  (1.854 m)   Wt 178 lb 2 oz (80.8 kg)   SpO2 99%   BMI 23.50 kg/m  General:  well developed, well nourished, in no apparent distress Heart: Regular rhythm, bradycardic, no bruits, 1+ pitting bilateral LE edema tapering at the mid tibia Lungs: clear to auscultation, no accessory muscle use Psych: well oriented with normal range of affect and appropriate judgment/insight  Essential hypertension, benign - Plan: hydrochlorothiazide (HYDRODIURIL) 12.5 MG tablet  Status: Chronic, uncontrolled.  Restart hydrochlorothiazide 12.5 mg daily.  He specifically requested this as he has tolerated well in the past and it did help with swelling in his lower extremities.  Continue to monitor blood pressure at home.  Counseled on diet and exercise. F/u in 1 month to recheck if he does end up staying on the medication, he will let me know on MyChart. The patient voiced understanding and agreement to the plan.  Ramirez-Perez, DO 03/30/21  12:03 PM

## 2021-03-30 NOTE — Telephone Encounter (Signed)
Call Type Triage / Clinical Relationship To Patient Self Return Phone Number [phone] (Primary) Chief Complaint Blood Pressure High Reason for Call Symptomatic / Request for Health Information Initial Comment Caller reports that he was seen in urgent care this morning for having vision issues and his blood pressure was high. It is currently 163/93. He does not take any medications for blood pressure. Translation No Nurse Assessment Nurse: Waymon Budge, RN, Vaughan Basta Date/Time (Eastern Time): 03/28/2021 12:49:32 PM Confirm and document reason for call. If symptomatic, describe symptoms. ---Caller reports that he was seen in urgent care this morning for having vision issues and his blood pressure was high. It is currently 163/93. He does not take any medications for blood pressure. BP is normally 120-130/60-70. Today SBP 150 or greater. No vision issue now. 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 States he feels fine.

## 2021-03-30 NOTE — Telephone Encounter (Signed)
Patient has an appointment with you today at 1115am.  He did go to Urgent Care over the weekend for a different reason.

## 2021-04-01 ENCOUNTER — Other Ambulatory Visit: Payer: Self-pay | Admitting: Family Medicine

## 2021-04-01 DIAGNOSIS — I10 Essential (primary) hypertension: Secondary | ICD-10-CM

## 2021-04-01 MED ORDER — HYDROCHLOROTHIAZIDE 12.5 MG PO TABS: 12.5000 mg | ORAL_TABLET | Freq: Every day | ORAL | 2 refills | Status: DC

## 2021-04-02 ENCOUNTER — Other Ambulatory Visit: Payer: Self-pay | Admitting: Family Medicine

## 2021-04-02 MED ORDER — TELMISARTAN 40 MG PO TABS: 40.0000 mg | ORAL_TABLET | Freq: Every day | ORAL | 3 refills | Status: DC

## 2021-04-09 ENCOUNTER — Other Ambulatory Visit: Payer: Self-pay | Admitting: Family Medicine

## 2021-06-19 ENCOUNTER — Other Ambulatory Visit: Payer: Self-pay | Admitting: Family Medicine

## 2021-06-19 MED ORDER — TELMISARTAN 40 MG PO TABS: 40.0000 mg | ORAL_TABLET | Freq: Every day | ORAL | 0 refills | Status: DC

## 2021-07-20 ENCOUNTER — Other Ambulatory Visit: Payer: Self-pay

## 2021-07-20 ENCOUNTER — Encounter: Payer: Self-pay | Admitting: Family Medicine

## 2021-07-20 ENCOUNTER — Ambulatory Visit (INDEPENDENT_AMBULATORY_CARE_PROVIDER_SITE_OTHER): Payer: Medicare Other | Admitting: Family Medicine

## 2021-07-20 VITALS — BP 124/78 | HR 68 | Temp 98.0°F | Ht 72.0 in | Wt 179.4 lb

## 2021-07-20 DIAGNOSIS — I10 Essential (primary) hypertension: Secondary | ICD-10-CM | POA: Diagnosis not present

## 2021-07-20 DIAGNOSIS — M1A09X0 Idiopathic chronic gout, multiple sites, without tophus (tophi): Secondary | ICD-10-CM

## 2021-07-20 DIAGNOSIS — I700 Atherosclerosis of aorta: Secondary | ICD-10-CM

## 2021-07-20 LAB — LIPID PANEL
Cholesterol: 147 mg/dL (ref 0–200)
HDL: 59.6 mg/dL (ref >39.00)
LDL Cholesterol: 74 mg/dL (ref 0–99)
NonHDL: 87.66
Total CHOL/HDL Ratio: 2
Triglycerides: 68 mg/dL (ref 0.0–149.0)
VLDL: 13.6 mg/dL (ref 0.0–40.0)

## 2021-07-20 LAB — COMPREHENSIVE METABOLIC PANEL
ALT: 8 U/L (ref 0–53)
AST: 15 U/L (ref 0–37)
Albumin: 4.1 g/dL (ref 3.5–5.2)
Alkaline Phosphatase: 53 U/L (ref 39–117)
BUN: 28 mg/dL — ABNORMAL HIGH (ref 6–23)
CO2: 31 mEq/L (ref 19–32)
Calcium: 9.4 mg/dL (ref 8.4–10.5)
Chloride: 101 mEq/L (ref 96–112)
Creatinine, Ser: 1.6 mg/dL — ABNORMAL HIGH (ref 0.40–1.50)
GFR: 38.71 mL/min — ABNORMAL LOW (ref >60.00)
Glucose, Bld: 94 mg/dL (ref 70–99)
Potassium: 4.3 mEq/L (ref 3.5–5.1)
Sodium: 139 mEq/L (ref 135–145)
Total Bilirubin: 0.8 mg/dL (ref 0.2–1.2)
Total Protein: 6.6 g/dL (ref 6.0–8.3)

## 2021-07-20 LAB — URIC ACID: Uric Acid, Serum: 6.1 mg/dL (ref 4.0–7.8)

## 2021-07-20 NOTE — Progress Notes (Signed)
Chief Complaint  Patient presents with   Follow-up    Subjective Steven Pearson is a 87 y.o. male who presents for hypertension follow up. He does monitor home blood pressures. Blood pressures ranging from 120's/70-80's on average. He is compliant with medications- HCTZ 12.5 mg/d, Micardis 40 mg/d. Patient has these side effects of medication: lightheadedness when he stands up He is adhering to a healthy diet overall. Current exercise: golfing, walking No Cp or SOB.   Hyperlipidemia Patient presents for hyperlipidemia follow up. Currently being treated with Lipitor 40 mg/d and compliance with treatment thus far has been good. He denies myalgias. Diet/exercise.  The patient is not known to have coexisting coronary artery disease.  Gout Pt w hx of gout on allopurinol 100 mg/d. No AE's, reports compliance. No recent flares.    Past Medical History:  Diagnosis Date   BPH (benign prostatic hypertrophy)    GERD (gastroesophageal reflux disease)    HOH (hard of hearing)    both ears   Hyperlipidemia    Hypertension    Normal cardiac stress test 11/28/13   Treadmill   PVC (premature ventricular contraction)     Exam BP 124/78   Pulse 68   Temp 98 F (36.7 C) (Oral)   Ht 6' (1.829 m)   Wt 179 lb 6 oz (81.4 kg)   SpO2 98%   BMI 24.33 kg/m  General:  well developed, well nourished, in no apparent distress Heart: RRR, no bruits, no LE edema Lungs: clear to auscultation, no accessory muscle use Psych: well oriented with normal range of affect and appropriate judgment/insight  Essential hypertension, benign - Plan: Comprehensive metabolic panel  Aortic atherosclerosis (HCC), Chronic - Plan: Lipid panel  Chronic gout of multiple sites, unspecified cause - Plan: Uric acid  Chronic, stable. Cont HCTZ 12.5 mg/d, Micardis 40 mg/d. He monitors BP daily. Counseled on diet and exercise. Chronic, stable. Cont Lipitor 40 mg/d. Ck labs. Chronic, stable. Ck labs, cont allopurinol 100  mg/d.  F/u in 6 mo. The patient voiced understanding and agreement to the plan.  [place], DO 07/20/21  9:29 AM

## 2021-07-20 NOTE — Patient Instructions (Signed)
Give us 2-3 business days to get the results of your labs back.   Keep the diet clean and stay active.  Let us know if you need anything. 

## 2021-07-28 DIAGNOSIS — I10 Essential (primary) hypertension: Secondary | ICD-10-CM

## 2021-07-28 MED ORDER — HYDROCHLOROTHIAZIDE 12.5 MG PO TABS: 12.5000 mg | ORAL_TABLET | Freq: Every day | ORAL | 3 refills | Status: DC

## 2021-07-31 DIAGNOSIS — N401 Enlarged prostate with lower urinary tract symptoms: Secondary | ICD-10-CM | POA: Diagnosis not present

## 2021-07-31 DIAGNOSIS — R338 Other retention of urine: Secondary | ICD-10-CM | POA: Diagnosis not present

## 2021-08-30 ENCOUNTER — Other Ambulatory Visit: Payer: Self-pay | Admitting: Family Medicine

## 2021-08-30 DIAGNOSIS — I700 Atherosclerosis of aorta: Secondary | ICD-10-CM

## 2021-09-02 ENCOUNTER — Ambulatory Visit (INDEPENDENT_AMBULATORY_CARE_PROVIDER_SITE_OTHER): Payer: Medicare Other

## 2021-09-02 ENCOUNTER — Ambulatory Visit: Payer: Medicare Other | Admitting: Family Medicine

## 2021-09-02 VITALS — Ht 72.0 in | Wt 165.0 lb

## 2021-09-02 DIAGNOSIS — Z0000 Encounter for general adult medical examination without abnormal findings: Secondary | ICD-10-CM | POA: Diagnosis not present

## 2021-09-02 NOTE — Progress Notes (Signed)
Subjective:   Steven Pearson is a 87 y.o. male who presents for Medicare Annual/Subsequent preventive examination.  I connected with Mackson today by telephone and verified that I am speaking with the correct person using two identifiers. Location patient: home Location provider: work Persons participating in the virtual visit: patient, Marine scientist.    I discussed the limitations, risks, security and privacy concerns of performing an evaluation and management service by telephone and the availability of in person appointments. I also discussed with the patient that there may be a patient responsible charge related to this service. The patient expressed understanding and verbally consented to this telephonic visit.    Interactive audio and video telecommunications were attempted between this provider and patient, however failed, due to patient having technical difficulties OR patient did not have access to video capability.  We continued and completed visit with audio only.  Some vital signs may be absent or patient reported.   Time Spent with patient on telephone encounter: 20 minutes   Review of Systems     Cardiac Risk Factors include: advanced age (>85mn, >>67women);male gender;dyslipidemia;hypertension     Objective:    Today's Vitals   09/02/21 0900  Weight: 165 lb (74.8 kg)  Height: 6' (1.829 m)   Body mass index is 22.38 kg/m.  Advanced Directives 09/02/2021 03/06/2018 03/01/2018 05/14/2014  Does Patient Have a Medical Advance Directive? Yes Yes Yes Patient has advance directive, copy not in chart;Patient would not like information  Type of Advance [place]ive [place] will [place] will -  Does patient want to make changes to medical advance directive? - No - Patient declined No - Patient declined -  Copy of [place] Chart? No - copy requested No - copy requested No -  copy requested -    Current Medications (verified) Outpatient Encounter Medications as of 09/02/2021  Medication Sig   allopurinol (ZYLOPRIM) 100 MG tablet Take 1 tablet (100 mg total) by mouth daily.   Ascorbic Acid (VITAMIN C) 1000 MG tablet Take 1,000 mg by mouth daily.   atorvastatin (LIPITOR) 40 MG tablet TAKE 1 TABLET DAILY   Cholecalciferol (VITAMIN D-3) 5000 UNITS TABS Take 5,000 Units by mouth every other day.    Cyanocobalamin (B-12) 5000 MCG CAPS Take 5,000 mcg by mouth daily.   folic acid (FOLVITE) 8Q000111QMCG tablet Take 400 mcg by mouth daily.   hydrochlorothiazide (HYDRODIURIL) 12.5 MG tablet Take 1 tablet (12.5 mg total) by mouth daily.   NON FORMULARY Take 120 mLs by mouth daily. Kefir probiotic otc   omeprazole (PRILOSEC) 10 MG capsule TAKE 1 CAPSULE DAILY   Probiotic Product (PROBIOTIC ADVANCED PO) Take 1 capsule by mouth daily.   telmisartan (MICARDIS) 40 MG tablet TAKE 1 TABLET DAILY   Ubiquinol 100 MG CAPS Take by mouth.   No facility-administered encounter medications on file as of 09/02/2021.    Allergies (verified) Nitrofuran derivatives   History: Past Medical History:  Diagnosis Date   BPH (benign prostatic hypertrophy)    GERD (gastroesophageal reflux disease)    HOH (hard of hearing)    both ears   Hyperlipidemia    Hypertension    Normal cardiac stress test 11/28/13   Treadmill   PVC (premature ventricular contraction)    Past Surgical History:  Procedure Laterality Date   basal cell skin areas removed     more than 1 removed   CATARACT EXTRACTION, BILATERAL  left eye poor vision now   CYSTOSCOPY N/A 03/06/2018   Procedure: CYSTOSCOPY;  Surgeon: Raynelle Bring, MD;  Location: [facility];  Service: Urology;  Laterality: N/A;   HAND SURGERY     right   squamous cell skin pre cancer areas removed     x 1   TONSILLECTOMY     TRANSURETHRAL RESECTION OF PROSTATE N/A 03/06/2018   Procedure: TRANSURETHRAL RESECTION OF THE PROSTATE (TURP);  Surgeon:  Raynelle Bring, MD;  Location: [facility];  Service: Urology;  Laterality: N/A;   TRANSURETHRAL RESECTION OF PROSTATE  01/06/2021   Family History  Problem Relation Age of Onset   Heart Problems Mother    Heart disease Father        Pacemaker   Social History   Socioeconomic History   Marital status: Widowed    Spouse name: Not on file   Number of children: 2   Years of education: Not on file   Highest education level: Not on file  Occupational History   Not on file  Tobacco Use   Smoking status: Former    Packs/day: 1.50    Years: 45.00    Pack years: 67.50    Types: Cigarettes    Quit date: 08/17/1999    Years since quitting: 22.0   Smokeless tobacco: Never  Vaping Use   Vaping Use: Never used  Substance and Sexual Activity   Alcohol use: Yes    Alcohol/week: 5.0 standard drinks    Types: 5 Glasses of wine per week    Comment: glass of wine per day   Drug use: No   Sexual activity: Not Currently  Other Topics Concern   Not on file  Social History Narrative   Not on file   Social Determinants of Health   Financial Resource Strain: Low Risk    Difficulty of Paying Living Expenses: Not hard at all  Food Insecurity: No Food Insecurity   Worried About Charity fundraiser in the Last Year: Never true   [place] in the Last Year: Never true  Transportation Needs: No Transportation Needs   Lack of Transportation (Medical): No   Lack of Transportation (Non-Medical): No  Physical Activity: Insufficiently Active   Days of Exercise per Week: 4 days   Minutes of Exercise per Session: 30 min  Stress: No Stress Concern Present   Feeling of Stress : Not at all  Social Connections: Moderately Isolated   Frequency of Communication with Friends and Family: More than three times a week   Frequency of Social Gatherings with Friends and Family: More than three times a week   Attends Religious Services: Never   Marine scientist or Organizations: Yes   Attends Programme researcher, broadcasting/film/video: More than 4 times per year   Marital Status: Widowed    Tobacco Counseling Counseling given: Not Answered   Clinical Intake:  Pre-visit preparation completed: Yes        BMI - recorded: 22.38 Nutritional Status: BMI of 19-24  Normal Nutritional Risks: Unintentional weight loss (due to recent infection) Diabetes: No  How often do you need to have someone help you when you read instructions, pamphlets, or other written materials from your doctor or pharmacy?: 1 - Never  Diabetic?No  Interpreter Needed?: No  Information entered by :: Caroleen Hamman LPN   Activities of Daily Living In your present state of health, do you have any difficulty performing the following activities: 09/02/2021  Hearing? Darreld Mclean  Vision? N  Difficulty concentrating or making decisions? N  Walking or climbing stairs? N  Dressing or bathing? N  Doing errands, shopping? N  Preparing Food and eating ? N  Using the Toilet? N  In the past six months, have you accidently leaked urine? N  Do you have problems with loss of bowel control? N  Managing your Medications? N  Managing your Finances? N  Housekeeping or managing your Housekeeping? N  Some recent data might be hidden    Patient Care Team: Shelda Pal, DO as PCP - General (Family Medicine)  Indicate any recent Medical Services you may have received from other than [facility] providers in the past year (date may be approximate).     Assessment:   This is a routine wellness examination for Steven Pearson.  Hearing/Vision screen Hearing Screening - Comments:: C/o some hearing Vision Screening - Comments:: Last eye exam-several years ago Reading glasses  Dietary issues and exercise activities discussed: Current Exercise Habits: Home exercise routine, Type of exercise: walking, Time (Minutes): 30, Frequency (Times/Week): 4, Weekly Exercise (Minutes/Week): 120, Intensity: Mild, Exercise limited by: None identified   Goals  Addressed             This Visit's Progress    Patient Stated       Maintain current healthy active lifestyle       Depression Screen PHQ 2/9 Scores 09/02/2021 07/20/2021 08/20/2020 10/05/2017 04/14/2016 05/14/2014  PHQ - 2 Score 0 0 0 0 0 0    Fall Risk Fall Risk  09/02/2021 07/20/2021 11/21/2019 10/05/2017 07/25/2017  Falls in the past year? 0 0 0 No No  Comment - - Emmi Telephone Survey: data to providers prior to load - Emmi Telephone Survey: data to providers prior to load  Number falls in past yr: 0 0 - - -  Injury with Fall? 0 0 - - -  Risk for fall due to : - No Fall Risks - - -  Follow up Falls prevention discussed Falls evaluation completed - - -    FALL RISK PREVENTION PERTAINING TO THE HOME:  Any stairs in or around the home? No  Home free of loose throw rugs in walkways, pet beds, electrical cords, etc? Yes  Adequate lighting in your home to reduce risk of falls? Yes   ASSISTIVE DEVICES UTILIZED TO PREVENT FALLS:  Life alert? No  Use of a cane, walker or w/c? No  Grab bars in the bathroom? Yes  Shower chair or bench in shower? No  Elevated toilet seat or a handicapped toilet? No   TIMED UP AND GO:  Was the test performed? No . Phone visit   Cognitive Function:Normal cognitive status assessed by this Nurse Health Advisor. No abnormalities found.       6CIT Screen 10/05/2017  What Year? 0 points  What month? 0 points  What time? 0 points  Count back from 20 0 points  Months in reverse 0 points  Repeat phrase 4 points  Total Score 4    Immunizations Immunization History  Administered Date(s) Administered   Influenza Split 09/20/2012, 09/24/2013   Influenza, High Dose Seasonal PF 10/05/2017   Influenza,inj,Quad PF,6+ Mos 09/24/2016   Influenza-Unspecified 09/26/2014, 09/27/2015, 09/26/2018   Moderna Sars-Covid-2 Vaccination 01/11/2020, 02/11/2020, 10/29/2020, 07/08/2021   Pneumococcal Conjugate-13 10/05/2017   Pneumococcal Polysaccharide-23  09/20/2012    TDAP status: Due, Education has been provided regarding the importance of this vaccine. Advised may receive this vaccine at local pharmacy or Health Dept.  Aware to provide a copy of the vaccination record if obtained from local pharmacy or Health Dept. Verbalized acceptance and understanding.  Flu Vaccine status: Due, Education has been provided regarding the importance of this vaccine. Advised may receive this vaccine at local pharmacy or Health Dept. Aware to provide a copy of the vaccination record if obtained from local pharmacy or Health Dept. Verbalized acceptance and understanding.  Pneumococcal vaccine status: Up to date  Covid-19 vaccine status: Completed vaccines  Qualifies for Shingles Vaccine? Yes   Zostavax completed No   Shingrix Completed?: No.    Education has been provided regarding the importance of this vaccine. Patient has been advised to call insurance company to determine out of pocket expense if they have not yet received this vaccine. Advised may also receive vaccine at local pharmacy or Health Dept. Verbalized acceptance and understanding.  Screening Tests Health Maintenance  Topic Date Due   INFLUENZA VACCINE  07/27/2021   TETANUS/TDAP  08/20/2024 (Originally 11/26/2019)   COVID-19 Vaccine (5 - Booster for Moderna series) 11/08/2021   PNA vac Low Risk Adult  Completed   HPV VACCINES  Aged Out   Zoster Vaccines- Shingrix  Discontinued    Health Maintenance  Health Maintenance Due  Topic Date Due   INFLUENZA VACCINE  07/27/2021    Colorectal cancer screening: No longer required.   Lung Cancer Screening: (Low Dose CT Chest recommended if Age 83-80 years, 30 pack-year currently smoking OR have quit w/in 15years.) does not qualify.     Additional Screening:  Hepatitis C Screening: does not qualify  Vision Screening: Recommended annual ophthalmology exams for early detection of glaucoma and other disorders of the eye. Is the patient up to  date with their annual eye exam?  No  Who is the provider or what is the name of the office in which the patient attends annual eye exams? Dr. Amalia Hailey Patient has an upcoming appt  Dental Screening: Recommended annual dental exams for proper oral hygiene  Community Resource Referral / Chronic Care Management: CRR required this visit?  No   CCM required this visit?  No      Plan:     I have personally reviewed and noted the following in the patient's chart:   Medical and social history Use of alcohol, tobacco or illicit drugs  Current medications and supplements including opioid prescriptions. Patient is not currently taking opioid prescriptions. Functional ability and status Nutritional status Physical activity Advanced directives List of other physicians Hospitalizations, surgeries, and ER visits in previous 12 months Vitals Screenings to include cognitive, depression, and falls Referrals and appointments  In addition, I have reviewed and discussed with patient certain preventive protocols, quality metrics, and best practice recommendations. A written personalized care plan for preventive services as well as general preventive health recommendations were provided to patient.   Due to this being a telephonic visit, the after visit summary with patients personalized plan was offered to patient via mail or my-chart. Patient would like to access on my-chart.   Marta Antu, LPN   [id number]  Nurse Health Advisor  Nurse Notes: None

## 2021-09-02 NOTE — Patient Instructions (Signed)
Steven Pearson , Thank you for taking time to complete your Medicare Wellness Visit. I appreciate your ongoing commitment to your health goals. Please review the following plan we discussed and let me know if I can assist you in the future.   Screening recommendations/referrals: Colonoscopy: No longer required Recommended yearly ophthalmology/optometry visit for glaucoma screening and checkup Recommended yearly dental visit for hygiene and checkup  Vaccinations: Influenza vaccine: Due-May obtain vaccine at our office or your local pharmacy Pneumococcal vaccine: Up to date Tdap vaccine: Discuss with pharmacy Shingles vaccine: Discuss with pharmacy   Covid-19: Up to date  Advanced directives: Please bring a copy for your chart  Conditions/risks identified: See problem list  Next appointment: Follow up in one year for your annual wellness visit.   Preventive Care 85 Years and Older, Male Preventive care refers to lifestyle choices and visits with your health care provider that can promote health and wellness. What does preventive care include? A yearly physical exam. This is also called an annual well check. Dental exams once or twice a year. Routine eye exams. Ask your health care provider how often you should have your eyes checked. Personal lifestyle choices, including: Daily care of your teeth and gums. Regular physical activity. Eating a healthy diet. Avoiding tobacco and drug use. Limiting alcohol use. Practicing safe sex. Taking low doses of aspirin every day. Taking vitamin and mineral supplements as recommended by your health care provider. What happens during an annual well check? The services and screenings done by your health care provider during your annual well check will depend on your age, overall health, lifestyle risk factors, and family history of disease. Counseling  Your health care provider may ask you questions about your: Alcohol use. Tobacco use. Drug  use. Emotional well-being. Home and relationship well-being. Sexual activity. Eating habits. History of falls. Memory and ability to understand (cognition). Work and work Statistician. Screening  You may have the following tests or measurements: Height, weight, and BMI. Blood pressure. Lipid and cholesterol levels. These may be checked every 5 years, or more frequently if you are over 52 years old. Skin check. Lung cancer screening. You may have this screening every year starting at age 55 if you have a 30-pack-year history of smoking and currently smoke or have quit within the past 15 years. Fecal occult blood test (FOBT) of the stool. You may have this test every year starting at age 33. Flexible sigmoidoscopy or colonoscopy. You may have a sigmoidoscopy every 5 years or a colonoscopy every 10 years starting at age 43. Prostate cancer screening. Recommendations will vary depending on your family history and other risks. Hepatitis C blood test. Hepatitis B blood test. Sexually transmitted disease (STD) testing. Diabetes screening. This is done by checking your blood sugar (glucose) after you have not eaten for a while (fasting). You may have this done every 1-3 years. Abdominal aortic aneurysm (AAA) screening. You may need this if you are a current or former smoker. Osteoporosis. You may be screened starting at age 80 if you are at high risk. Talk with your health care provider about your test results, treatment options, and if necessary, the need for more tests. Vaccines  Your health care provider may recommend certain vaccines, such as: Influenza vaccine. This is recommended every year. Tetanus, diphtheria, and acellular pertussis (Tdap, Td) vaccine. You may need a Td booster every 10 years. Zoster vaccine. You may need this after age 14. Pneumococcal 13-valent conjugate (PCV13) vaccine. One dose is recommended  after age 26. Pneumococcal polysaccharide (PPSV23) vaccine. One dose is  recommended after age 80. Talk to your health care provider about which screenings and vaccines you need and how often you need them. This information is not intended to replace advice given to you by your health care provider. Make sure you discuss any questions you have with your health care provider. Document Released: 01/09/2016 Document Revised: 09/01/2016 Document Reviewed: 10/14/2015 Elsevier Interactive Patient Education  2017 [employer] Prevention in the Home Falls can cause injuries. They can happen to people of all ages. There are many things you can do to make your home safe and to help prevent falls. What can I do on the outside of my home? Regularly fix the edges of walkways and driveways and fix any cracks. Remove anything that might make you trip as you walk through a door, such as a raised step or threshold. Trim any bushes or trees on the path to your home. Use bright outdoor lighting. Clear any walking paths of anything that might make someone trip, such as rocks or tools. Regularly check to see if handrails are loose or broken. Make sure that both sides of any steps have handrails. Any raised decks and porches should have guardrails on the edges. Have any leaves, snow, or ice cleared regularly. Use sand or salt on walking paths during winter. Clean up any spills in your garage right away. This includes oil or grease spills. What can I do in the bathroom? Use night lights. Install grab bars by the toilet and in the tub and shower. Do not use towel bars as grab bars. Use non-skid mats or decals in the tub or shower. If you need to sit down in the shower, use a plastic, non-slip stool. Keep the floor dry. Clean up any water that spills on the floor as soon as it happens. Remove soap buildup in the tub or shower regularly. Attach bath mats securely with double-sided non-slip rug tape. Do not have throw rugs and other things on the floor that can make you  trip. What can I do in the bedroom? Use night lights. Make sure that you have a light by your bed that is easy to reach. Do not use any sheets or blankets that are too big for your bed. They should not hang down onto the floor. Have a firm chair that has side arms. You can use this for support while you get dressed. Do not have throw rugs and other things on the floor that can make you trip. What can I do in the kitchen? Clean up any spills right away. Avoid walking on wet floors. Keep items that you use a lot in easy-to-reach places. If you need to reach something above you, use a strong step stool that has a grab bar. Keep electrical cords out of the way. Do not use floor polish or wax that makes floors slippery. If you must use wax, use non-skid floor wax. Do not have throw rugs and other things on the floor that can make you trip. What can I do with my stairs? Do not leave any items on the stairs. Make sure that there are handrails on both sides of the stairs and use them. Fix handrails that are broken or loose. Make sure that handrails are as long as the stairways. Check any carpeting to make sure that it is firmly attached to the stairs. Fix any carpet that is loose or worn. Avoid having throw  rugs at the top or bottom of the stairs. If you do have throw rugs, attach them to the floor with carpet tape. Make sure that you have a light switch at the top of the stairs and the bottom of the stairs. If you do not have them, ask someone to add them for you. What else can I do to help prevent falls? Wear shoes that: Do not have high heels. Have rubber bottoms. Are comfortable and fit you well. Are closed at the toe. Do not wear sandals. If you use a stepladder: Make sure that it is fully opened. Do not climb a closed stepladder. Make sure that both sides of the stepladder are locked into place. Ask someone to hold it for you, if possible. Clearly mark and make sure that you can  see: Any grab bars or handrails. First and last steps. Where the edge of each step is. Use tools that help you move around (mobility aids) if they are needed. These include: Canes. Walkers. Scooters. Crutches. Turn on the lights when you go into a dark area. Replace any light bulbs as soon as they burn out. Set up your furniture so you have a clear path. Avoid moving your furniture around. If any of your floors are uneven, fix them. If there are any pets around you, be aware of where they are. Review your medicines with your doctor. Some medicines can make you feel dizzy. This can increase your chance of falling. Ask your doctor what other things that you can do to help prevent falls. This information is not intended to replace advice given to you by your health care provider. Make sure you discuss any questions you have with your health care provider. Document Released: 10/09/2009 Document Revised: 05/20/2016 Document Reviewed: 01/17/2015 Elsevier Interactive Patient Education  2017 [employer].

## 2021-10-05 ENCOUNTER — Other Ambulatory Visit: Payer: Self-pay | Admitting: Family Medicine

## 2021-10-05 DIAGNOSIS — I10 Essential (primary) hypertension: Secondary | ICD-10-CM

## 2021-10-05 MED ORDER — HYDROCHLOROTHIAZIDE 12.5 MG PO TABS: 12.5000 mg | ORAL_TABLET | Freq: Every day | ORAL | 0 refills | Status: DC

## 2021-10-09 ENCOUNTER — Other Ambulatory Visit: Payer: Self-pay | Admitting: Family Medicine

## 2021-10-09 DIAGNOSIS — R5381 Other malaise: Secondary | ICD-10-CM

## 2021-10-09 NOTE — Progress Notes (Signed)
re

## 2021-10-19 DIAGNOSIS — M6281 Muscle weakness (generalized): Secondary | ICD-10-CM | POA: Diagnosis not present

## 2021-10-19 DIAGNOSIS — R2681 Unsteadiness on feet: Secondary | ICD-10-CM | POA: Diagnosis not present

## 2021-10-30 DIAGNOSIS — R2681 Unsteadiness on feet: Secondary | ICD-10-CM | POA: Diagnosis not present

## 2021-10-30 DIAGNOSIS — M6281 Muscle weakness (generalized): Secondary | ICD-10-CM | POA: Diagnosis not present

## 2021-11-10 ENCOUNTER — Other Ambulatory Visit: Payer: Self-pay | Admitting: Family Medicine

## 2021-11-10 DIAGNOSIS — I10 Essential (primary) hypertension: Secondary | ICD-10-CM

## 2021-11-18 ENCOUNTER — Encounter: Payer: Self-pay | Admitting: Family Medicine

## 2021-11-27 DIAGNOSIS — R2681 Unsteadiness on feet: Secondary | ICD-10-CM | POA: Diagnosis not present

## 2021-11-27 DIAGNOSIS — M6281 Muscle weakness (generalized): Secondary | ICD-10-CM | POA: Diagnosis not present

## 2021-12-09 ENCOUNTER — Encounter: Payer: Self-pay | Admitting: Family Medicine

## 2021-12-17 ENCOUNTER — Telehealth: Payer: Self-pay | Admitting: Family Medicine

## 2021-12-17 DIAGNOSIS — I700 Atherosclerosis of aorta: Secondary | ICD-10-CM

## 2021-12-17 DIAGNOSIS — I10 Essential (primary) hypertension: Secondary | ICD-10-CM

## 2021-12-17 DIAGNOSIS — M1A09X0 Idiopathic chronic gout, multiple sites, without tophus (tophi): Secondary | ICD-10-CM

## 2021-12-17 NOTE — Telephone Encounter (Signed)
Okay to schedule prior CPE and if so , what labs do you want placed

## 2021-12-17 NOTE — Telephone Encounter (Signed)
Pt would like to complete blood work before his appt on 1/16

## 2021-12-18 NOTE — Telephone Encounter (Signed)
Pt called and lvm to return to schedule appt

## 2021-12-18 NOTE — Telephone Encounter (Signed)
Orders placed for labs, OK to sched fasting lab appt at his convenience prior to his appt. Ty.

## 2021-12-18 NOTE — Telephone Encounter (Signed)
Sent pt a mychart message. 

## 2021-12-29 DIAGNOSIS — R2681 Unsteadiness on feet: Secondary | ICD-10-CM | POA: Diagnosis not present

## 2021-12-29 DIAGNOSIS — M6281 Muscle weakness (generalized): Secondary | ICD-10-CM | POA: Diagnosis not present

## 2021-12-31 NOTE — Addendum Note (Signed)
Addended by: Kelle Darting A on: 12/31/2021 01:39 PM   Modules accepted: Orders

## 2022-01-01 DIAGNOSIS — M6281 Muscle weakness (generalized): Secondary | ICD-10-CM | POA: Diagnosis not present

## 2022-01-01 DIAGNOSIS — R2681 Unsteadiness on feet: Secondary | ICD-10-CM | POA: Diagnosis not present

## 2022-01-04 DIAGNOSIS — R2681 Unsteadiness on feet: Secondary | ICD-10-CM | POA: Diagnosis not present

## 2022-01-04 DIAGNOSIS — M6281 Muscle weakness (generalized): Secondary | ICD-10-CM | POA: Diagnosis not present

## 2022-01-05 ENCOUNTER — Other Ambulatory Visit (INDEPENDENT_AMBULATORY_CARE_PROVIDER_SITE_OTHER): Payer: Medicare Other

## 2022-01-05 DIAGNOSIS — M1A09X0 Idiopathic chronic gout, multiple sites, without tophus (tophi): Secondary | ICD-10-CM | POA: Diagnosis not present

## 2022-01-05 DIAGNOSIS — I700 Atherosclerosis of aorta: Secondary | ICD-10-CM | POA: Diagnosis not present

## 2022-01-05 DIAGNOSIS — I10 Essential (primary) hypertension: Secondary | ICD-10-CM

## 2022-01-05 LAB — COMPREHENSIVE METABOLIC PANEL
ALT: 10 U/L (ref 0–53)
AST: 16 U/L (ref 0–37)
Albumin: 4 g/dL (ref 3.5–5.2)
Alkaline Phosphatase: 50 U/L (ref 39–117)
BUN: 25 mg/dL — ABNORMAL HIGH (ref 6–23)
CO2: 30 mEq/L (ref 19–32)
Calcium: 9.3 mg/dL (ref 8.4–10.5)
Chloride: 101 mEq/L (ref 96–112)
Creatinine, Ser: 1.63 mg/dL — ABNORMAL HIGH (ref 0.40–1.50)
GFR: 37.73 mL/min — ABNORMAL LOW (ref >60.00)
Glucose, Bld: 90 mg/dL (ref 70–99)
Potassium: 4.5 mEq/L (ref 3.5–5.1)
Sodium: 137 mEq/L (ref 135–145)
Total Bilirubin: 1.1 mg/dL (ref 0.2–1.2)
Total Protein: 6.4 g/dL (ref 6.0–8.3)

## 2022-01-05 LAB — LIPID PANEL
Cholesterol: 131 mg/dL (ref 0–200)
HDL: 53.5 mg/dL (ref >39.00)
LDL Cholesterol: 60 mg/dL (ref 0–99)
NonHDL: 77.9
Total CHOL/HDL Ratio: 2
Triglycerides: 91 mg/dL (ref 0.0–149.0)
VLDL: 18.2 mg/dL (ref 0.0–40.0)

## 2022-01-05 LAB — CBC
HCT: 41.3 % (ref 39.0–52.0)
Hemoglobin: 13.5 g/dL (ref 13.0–17.0)
MCHC: 32.6 g/dL (ref 30.0–36.0)
MCV: 99.4 fl (ref 78.0–100.0)
Platelets: 149 10*3/uL — ABNORMAL LOW (ref 150.0–400.0)
RBC: 4.15 Mil/uL — ABNORMAL LOW (ref 4.22–5.81)
RDW: 13.8 % (ref 11.5–15.5)
WBC: 5.2 10*3/uL (ref 4.0–10.5)

## 2022-01-05 LAB — URIC ACID: Uric Acid, Serum: 6.5 mg/dL (ref 4.0–7.8)

## 2022-01-08 DIAGNOSIS — R2681 Unsteadiness on feet: Secondary | ICD-10-CM | POA: Diagnosis not present

## 2022-01-08 DIAGNOSIS — M6281 Muscle weakness (generalized): Secondary | ICD-10-CM | POA: Diagnosis not present

## 2022-01-11 ENCOUNTER — Encounter: Payer: Self-pay | Admitting: Family Medicine

## 2022-01-11 ENCOUNTER — Ambulatory Visit (INDEPENDENT_AMBULATORY_CARE_PROVIDER_SITE_OTHER): Payer: Medicare Other | Admitting: Family Medicine

## 2022-01-11 VITALS — BP 128/80 | HR 87 | Temp 97.4°F | Ht 72.0 in | Wt 179.0 lb

## 2022-01-11 DIAGNOSIS — R7303 Prediabetes: Secondary | ICD-10-CM

## 2022-01-11 DIAGNOSIS — I10 Essential (primary) hypertension: Secondary | ICD-10-CM

## 2022-01-11 DIAGNOSIS — N1832 Chronic kidney disease, stage 3b: Secondary | ICD-10-CM

## 2022-01-11 DIAGNOSIS — M1A00X0 Idiopathic chronic gout, unspecified site, without tophus (tophi): Secondary | ICD-10-CM

## 2022-01-11 DIAGNOSIS — I700 Atherosclerosis of aorta: Secondary | ICD-10-CM

## 2022-01-11 NOTE — Progress Notes (Signed)
Chief Complaint  Patient presents with   Annual Exam    Back pain     Steven Pearson is a 88 y.o. male here for gout.  Currently being treated with allopurinol 100 mg/d. Most recent uric acid level is: 6.5 Reports compliance. Side effects of medications: None Is avoiding seafood, sweet/sugary beverages, alcohol, and red meats.  Hyperlipidemia Patient presents for dyslipidemia follow up. Currently being treated with Lipitor 40 mg/d and compliance with treatment thus far has been good. He denies myalgias. He is adhering to a healthy diet. Exercise: walking, physical therapy, wt resistance exercise The patient is not known to have coexisting coronary artery disease. No Cp or SOB.   Hypertension Patient presents for hypertension follow up. He does monitor home blood pressures. Blood pressures ranging on average from 120's/80's. He is compliant with medications- HCTZ 12.5 mg/d, Micardis 40 mg/d. Patient has these side effects of medication: none Diet/exercise as above.   Past Medical History:  Diagnosis Date   BPH (benign prostatic hypertrophy)    GERD (gastroesophageal reflux disease)    HOH (hard of hearing)    both ears   Hyperlipidemia    Hypertension    Normal cardiac stress test 11/28/13   Treadmill   PVC (premature ventricular contraction)     BP 128/80    Pulse 87    Temp (!) 97.4 F (36.3 C) (Oral)    Ht 6' (1.829 m)    Wt 179 lb (81.2 kg)    SpO2 98%    BMI 24.28 kg/m  Gen: Awake, alert, appears stated age Heart: RRR, no bruits Lungs: CTAB, no accessory muscle use MSK: no swelling or TTP, normal gait Skin: No erythema or warmth Psych: Age appropriate judgment and insight, nml mood and affect  Aortic atherosclerosis (HCC) - Plan: Comprehensive metabolic panel, Lipid panel  Idiopathic chronic gout without tophus, unspecified site - Plan: Uric acid  Essential hypertension, benign  Stage 3b chronic kidney disease (HCC)  Prediabetes - Plan: Hemoglobin  A1c  Chronic, stable. Cont Lipitor 40 mg/d.  Counseled on diet and exercise. Chronic, stable.  Continue allopurinol 100 mg daily.  Reminded to avoid foods like alcohol, sweet beverages, red meat, lunch meat, sea food. Chronic, stable.  Continue hydrochlorothiazide 12.5 mg daily, Micardis 40 mg daily. Monitor. Monitor. F/u in 6 months for physical or as needed.  He will return 1 week prior to get blood work The patient voiced understanding and agreement to the plan.  [place], DO 01/11/22 9:36 AM

## 2022-01-11 NOTE — Patient Instructions (Addendum)
When you have nosebleeds, use an air humidifier in addition to Neosporin on the affected side.   Consider using Flonase for nasal congestion/runny nose. OK to cont Claritin.   Let us know if you need anything.

## 2022-01-18 DIAGNOSIS — K4090 Unilateral inguinal hernia, without obstruction or gangrene, not specified as recurrent: Secondary | ICD-10-CM | POA: Diagnosis not present

## 2022-01-20 ENCOUNTER — Telehealth: Payer: Self-pay | Admitting: Family Medicine

## 2022-01-20 ENCOUNTER — Encounter: Payer: Self-pay | Admitting: Family Medicine

## 2022-01-20 ENCOUNTER — Ambulatory Visit (INDEPENDENT_AMBULATORY_CARE_PROVIDER_SITE_OTHER): Payer: Medicare Other | Admitting: Family Medicine

## 2022-01-20 ENCOUNTER — Ambulatory Visit: Payer: Medicare Other | Admitting: Family Medicine

## 2022-01-20 VITALS — BP 154/89 | HR 82 | Temp 97.5°F | Resp 16 | Ht 72.0 in

## 2022-01-20 DIAGNOSIS — K4090 Unilateral inguinal hernia, without obstruction or gangrene, not specified as recurrent: Secondary | ICD-10-CM

## 2022-01-20 DIAGNOSIS — R030 Elevated blood-pressure reading, without diagnosis of hypertension: Secondary | ICD-10-CM

## 2022-01-20 NOTE — Telephone Encounter (Signed)
Will have PCP sign/then fax back

## 2022-01-20 NOTE — Progress Notes (Signed)
Chief Complaint  Patient presents with   Hernia    Subjective: Patient is a 88 y.o. male here for possible hernia.  This past Monday, the patient was at his urologist office and a left-sided hernia was incidentally found.  He was having some discomfort in the left testicle region and his urologist thought this could be the cause.  He was scheduled an appointment on 2/6 with his regular urologist but the patient now notices the fullness in his left groin region more and would like it removed sooner.  Historically it takes several months for the surgery to be scheduled with his urology group.  He is not having any significant pain, bowel changes, or skin changes over the area.  He does not remember any inciting event causing this.  Past Medical History:  Diagnosis Date   BPH (benign prostatic hypertrophy)    GERD (gastroesophageal reflux disease)    HOH (hard of hearing)    both ears   Hyperlipidemia    Hypertension    Normal cardiac stress test 11/28/13   Treadmill   PVC (premature ventricular contraction)     Objective: BP (!) 154/89    Pulse 82    Temp (!) 97.5 F (36.4 C)    Resp 16    Ht 6' (1.829 m)    SpO2 99%    BMI 24.28 kg/m  General: Awake, appears stated age GU: There is a prominence of the left inguinal region compared to the right; bowel appreciated in the inguinal canal that increases in density with coughing Lungs: No accessory muscle use Psych: Age appropriate judgment and insight, normal affect and mood  Assessment and Plan: Left inguinal hernia - Plan: Ambulatory referral to General Surgery  Elevated blood pressure reading  Refer to the general surgery team to see if they can get this procedure done sooner.  He will still meet with the urology team on 2/6 and see if they can get him in in a reasonable time as well.  Warned about signs of incarceration.  Unfortunately forgot to recheck his blood pressure.  Will monitor in the office moving forward. The patient  voiced understanding and agreement to the plan.  [place], DO 01/20/22  3:41 PM

## 2022-01-20 NOTE — Patient Instructions (Addendum)
If you do not hear anything about your referral in the next week or so, call our office and ask for an update.  If you have extreme pain, please go to the ER.   Let us know if you need anything.

## 2022-01-20 NOTE — Telephone Encounter (Signed)
[facility] is calling to let us know that they have faxed a physical Therapy plan of care over 3 times since December and still have not received it back. Fax number was verified, and representative stated she would fax it to [place] again.

## 2022-01-22 ENCOUNTER — Encounter: Payer: Self-pay | Admitting: Family Medicine

## 2022-01-24 ENCOUNTER — Other Ambulatory Visit: Payer: Self-pay

## 2022-01-24 ENCOUNTER — Emergency Department (HOSPITAL_BASED_OUTPATIENT_CLINIC_OR_DEPARTMENT_OTHER)
Admission: EM | Admit: 2022-01-24 | Discharge: 2022-01-24 | Disposition: A | Discharge status: Home / Self Care | Payer: Medicare Other | Attending: Emergency Medicine | Admitting: Emergency Medicine

## 2022-01-24 ENCOUNTER — Encounter (HOSPITAL_BASED_OUTPATIENT_CLINIC_OR_DEPARTMENT_OTHER): Payer: Self-pay | Admitting: Emergency Medicine

## 2022-01-24 DIAGNOSIS — K4021 Bilateral inguinal hernia, without obstruction or gangrene, recurrent: Secondary | ICD-10-CM | POA: Insufficient documentation

## 2022-01-24 DIAGNOSIS — N183 Chronic kidney disease, stage 3 unspecified: Secondary | ICD-10-CM | POA: Diagnosis not present

## 2022-01-24 DIAGNOSIS — K4090 Unilateral inguinal hernia, without obstruction or gangrene, not specified as recurrent: Secondary | ICD-10-CM | POA: Diagnosis not present

## 2022-01-24 DIAGNOSIS — T148XXA Other injury of unspecified body region, initial encounter: Secondary | ICD-10-CM | POA: Diagnosis not present

## 2022-01-24 DIAGNOSIS — I129 Hypertensive chronic kidney disease with stage 1 through stage 4 chronic kidney disease, or unspecified chronic kidney disease: Secondary | ICD-10-CM | POA: Diagnosis not present

## 2022-01-24 DIAGNOSIS — R55 Syncope and collapse: Secondary | ICD-10-CM | POA: Diagnosis not present

## 2022-01-24 DIAGNOSIS — X58XXXA Exposure to other specified factors, initial encounter: Secondary | ICD-10-CM | POA: Insufficient documentation

## 2022-01-24 DIAGNOSIS — Z79899 Other long term (current) drug therapy: Secondary | ICD-10-CM | POA: Insufficient documentation

## 2022-01-24 LAB — CBG MONITORING, ED: Glucose-Capillary: 152 mg/dL — ABNORMAL HIGH (ref 70–99)

## 2022-01-24 NOTE — ED Provider Notes (Signed)
[facility] EMERGENCY DEPARTMENT Provider Note   CSN: [id number] Arrival date & time: 01/24/22  1117     History  Chief Complaint  Patient presents with   Loss of Consciousness    Steven Pearson is a 88 y.o. male.  With past medical history of GERD, hyperlipidemia, hypertension, CKD stage III who presents to the emergency department after syncopal episode.  Patient states that this morning he was in the shower.  He states that he has a known left inguinal hernia and it has been bothering him over the past week.  He states that while he was in the shower he was able to reduce the left-sided hernia.  He states that immediately after this he began feeling dizzy and lightheaded.  He states that he sat down on a bench in the shower however continued to have lightheadedness.  He states that his vision became somewhat blurred and then he had a syncopal episode falling and hitting his forehead.  He states that he is unsure how long he syncopized for however he thinks it was only seconds.  He states that he was unable to stand up and get himself out of the shower.  He states that the whole episode lasted 3 to 4 minutes from onset of dizziness to being asymptomatic.  He denies any chest pain, palpitations, shortness of breath prior to or after the event.  He denies any nausea or vomiting.  He denies any diaphoresis.  He has not had a syncopal episode previously.  He states that he thinks this is all related to his hernia.  He states that he is being seen by urology and was recently seen for the hernia and referred to [facility] surgery.  He states that they are not able to see him till April.  Denies any fevers, recent illnesses.  Not anticoagulated.   Loss of Consciousness Associated symptoms: dizziness   Associated symptoms: no chest pain, no fever, no nausea, no palpitations, no shortness of breath and no vomiting       Home Medications Prior to Admission medications   Medication  Sig Start Date End Date Taking? Authorizing Provider  allopurinol (ZYLOPRIM) 100 MG tablet TAKE 1 TABLET DAILY 11/10/21   Shelda Pal, DO  Ascorbic Acid (VITAMIN C) 1000 MG tablet Take 1,000 mg by mouth daily.    [provider]  atorvastatin (LIPITOR) 40 MG tablet TAKE 1 TABLET DAILY 09/01/21   Shelda Pal, DO  Cholecalciferol (VITAMIN D-3) 5000 UNITS TABS Take 5,000 Units by mouth every other day.     [provider]  Cyanocobalamin (B-12) 5000 MCG CAPS Take 5,000 mcg by mouth daily.    [provider]  folic acid (FOLVITE) 846 MCG tablet Take 400 mcg by mouth daily.    [provider]  hydrochlorothiazide (HYDRODIURIL) 12.5 MG tablet TAKE 1 TABLET DAILY 11/10/21   Wendling, Crosby Oyster, DO  NON FORMULARY Take 120 mLs by mouth daily. Kefir probiotic otc    [provider]  omeprazole (PRILOSEC) 10 MG capsule TAKE 1 CAPSULE DAILY 11/10/21   Shelda Pal, DO  Probiotic Product (PROBIOTIC ADVANCED PO) Take 1 capsule by mouth daily.    [provider]  telmisartan (MICARDIS) 40 MG tablet TAKE 1 TABLET DAILY 11/10/21   Shelda Pal, DO  Ubiquinol 100 MG CAPS Take by mouth.    [provider]      Allergies    Nitrofuran derivatives    Review of  Systems   Review of Systems  Constitutional:  Negative for fever.  Eyes:  Positive for visual disturbance.  Respiratory:  Negative for shortness of breath.   Cardiovascular:  Positive for syncope. Negative for chest pain, palpitations and leg swelling.  Gastrointestinal:  Negative for abdominal pain, nausea and vomiting.  Neurological:  Positive for dizziness, syncope and light-headedness.  All other systems reviewed and are negative.  Physical Exam Updated Vital Signs BP 120/86 (BP Location: Right Arm)    Pulse 91    Temp 97.6 F (36.4 C) (Oral)    Resp 18    Ht 6' (1.829 m)    Wt 79.4 kg    SpO2 98%    BMI 23.73 kg/m  Physical  Exam Vitals and nursing note reviewed.  Constitutional:      General: He is not in acute distress.    Appearance: Normal appearance. He is normal weight. He is not ill-appearing or toxic-appearing.  HENT:     Head: Normocephalic.     Nose: Nose normal.     Mouth/Throat:     Mouth: Mucous membranes are moist.     Pharynx: Oropharynx is clear.  Eyes:     General: No scleral icterus.    Extraocular Movements: Extraocular movements intact.     Conjunctiva/sclera: Conjunctivae normal.     Pupils: Pupils are equal, round, and reactive to light.  Cardiovascular:     Rate and Rhythm: Normal rate and regular rhythm.     Pulses: Normal pulses.     Heart sounds: No murmur heard. Pulmonary:     Effort: Pulmonary effort is normal. No respiratory distress.     Breath sounds: Normal breath sounds.  Abdominal:     General: Bowel sounds are normal. There is no distension.     Palpations: Abdomen is soft.     Hernia: A hernia is present. Hernia is present in the left inguinal area.     Comments: Reducible right inguinal hernia Tenderness over area of hernia  Musculoskeletal:        General: Normal range of motion.     Cervical back: Normal range of motion and neck supple. No tenderness.     Right lower leg: No edema.     Left lower leg: No edema.  Skin:    General: Skin is warm and dry.     Capillary Refill: Capillary refill takes less than 2 seconds.  Neurological:     General: No focal deficit present.     Mental Status: He is alert and oriented to person, place, and time. Mental status is at baseline.     Cranial Nerves: No cranial nerve deficit.     Sensory: No sensory deficit.     Motor: No weakness.  Psychiatric:        Mood and Affect: Mood normal.        Behavior: Behavior normal.        Thought Content: Thought content normal.        Judgment: Judgment normal.    ED Results / Procedures / Treatments   Labs (all labs ordered are listed, but only abnormal results are  displayed) Labs Reviewed  CBG MONITORING, ED - Abnormal; Notable for the following components:      Result Value   Glucose-Capillary 152 (*)    All other components within normal limits   EKG EKG Interpretation  Date/Time:  Sunday January 24 2022 11:39:14 EST Ventricular Rate:  89 PR Interval:  148 QRS Duration:  90 QT Interval:  380 QTC Calculation: 462 R Axis:   76 Text Interpretation: Sinus rhythm with Premature atrial complexes Otherwise normal ECG When compared with ECG of 01-Mar-2018 11:06, PREVIOUS ECG IS PRESENT Confirmed by Lennice Sites ([id number]) on 01/24/2022 12:08:08 PM  Radiology No results found.  Procedures Procedures    Medications Ordered in ED Medications - No data to display  ED Course/ Medical Decision Making/ A&P                           Medical Decision Making Patient presents to the ED with complaints of syncope. This involves an extensive number of treatment options, and is a complaint that carries with it a high risk of complications and morbidity.   Additional history obtained:  Additional history obtained from: Significant other External records from outside source obtained and reviewed including: Previous primary care and urology visits  EKG: Sinus rhythm with PACs  Cardiac Monitoring: The patient was maintained on a cardiac monitor.  I personally viewed and interpreted the cardiac monitored which showed an underlying rhythm of: Sinus rhythm  Lab Results: I Ordered, reviewed, and interpreted labs. Pertinent results include: Blood glucose 152  Tests Considered: CBC, BMP, troponin, D-dimer, CT head, CT abdomen pelvis  ED Course: 88 year old male who presents to the emergency department after syncopal episode in the shower this morning.  On history and exam the patient is declining any work-up at this time.  He states that he feels this is due to him reducing his hernia in the bathroom and having a syncopal episode afterwards.  I have offered  him lab work, CT of his head and abdomen pelvis.  He continues to decline.  I have spoken with attending, Dr. Ronnald Nian, who has also spoken with the patient at bedside regarding reasons why we would do a work-up for syncope.  He again declines work-up at this time.  He would like to follow-up with general surgery for hernia repair and continues to verbalize that he thinks episode was related to self reduction of hernia.  We discussed the risk and benefit of him not being worked up at this time and included risks of incarcerated or strangulated hernia, intercranial abnormalities, cardiac etiologies of syncope as well as death..  Attending is okay with discharge with so we will have him follow-up with primary care and general surgery.  Patient verbalized understanding.  He is instructed return to the emergency department for further episodes of syncope.  He verbalized understanding.  His vital signs are stable.  Safe for discharge.  Dispostion:  After consideration of the diagnostic results and the patients response to treatment, I feel that the patent would benefit from discharge. The patient has been appropriately medically screened and/or stabilized in the ED. I have low suspicion for any other emergent medical condition which would require further screening, evaluation or treatment in the ED or require inpatient management  Final diagnoses:  Abrasion  Bilateral recurrent inguinal hernia without obstruction or gangrene  Vasovagal syncope    Rx / DC Orders ED Discharge Orders     None         Mickie Hillier, PA-C 01/24/22 Dele[place], Nunez, DO 01/25/22 [phone]

## 2022-01-24 NOTE — Discharge Instructions (Signed)
As discussed please return to the ED if you have worsening headache, weakness, numbness, other episode of passing out.  Suspect that your episode today was from reducing your hernia and from shower.

## 2022-01-24 NOTE — ED Provider Notes (Signed)
I personally evaluated the patient during the encounter and completed a history, physical, procedures, medical decision making to contribute to the overall care of the patient and decision making for the patient briefly, the patient is a 88 y.o. male who presents to the ED after syncopal event.  Patient with history of high cholesterol, hypertension, left inguinal hernia.  Patient states that he was in the shower when his left inguinal hernia popped out and he pushed it back in and shortly after he got lightheaded and fell in the shower.  He hit his head.  He thinks he lost consciousness.  He is feeling better now.  Hernia was reduced.  He has an abrasion to his forehead.  He is not on blood thinners.  EKG shows sinus rhythm.  No ischemic changes.  Overall he does not want lab work or imaging.  He is mostly concerned about his hernia and asking about information about when he would need to return for hernia issues.  He has follow-up with surgery in 2 months for likely surgery.  He states that when the hernia pops out it is easily reducible.  He has no hernia on exam.  Neurologically he is intact.  He has a small abrasion to his forehead.  He denies any headache or weakness or chest pain or shortness of breath.  Ultimately he does not want lab work or any imaging and I think that is reasonable as this seems to be a vasovagal event.  He understands return precautions and was discharged in ED in good condition.  This chart was dictated using voice recognition software.  Despite best efforts to proofread,  errors can occur which can change the documentation meaning.    EKG Interpretation  Date/Time:  Sunday January 24 2022 11:39:14 EST Ventricular Rate:  89 PR Interval:  148 QRS Duration: 90 QT Interval:  380 QTC Calculation: 462 R Axis:   76 Text Interpretation: Sinus rhythm with Premature atrial complexes Otherwise normal ECG When compared with ECG of 01-Mar-2018 11:06, PREVIOUS ECG IS PRESENT Confirmed  by Lennice Sites ([id number]) on 01/24/2022 12:08:08 PM            Lennice Sites, DO 01/24/22 [id number]

## 2022-01-24 NOTE — ED Triage Notes (Signed)
Pt arrives pov, unsteady gait to triage, endorses fall this am after syncope. Pt denies HA. Pt also c/o LLQ hernia. Lac to forehead, denies thinners. AOx4

## 2022-01-25 ENCOUNTER — Encounter: Payer: Self-pay | Admitting: Family Medicine

## 2022-02-01 DIAGNOSIS — R338 Other retention of urine: Secondary | ICD-10-CM | POA: Diagnosis not present

## 2022-02-01 DIAGNOSIS — K4090 Unilateral inguinal hernia, without obstruction or gangrene, not specified as recurrent: Secondary | ICD-10-CM | POA: Diagnosis not present

## 2022-02-01 DIAGNOSIS — R8271 Bacteriuria: Secondary | ICD-10-CM | POA: Diagnosis not present

## 2022-02-01 DIAGNOSIS — N401 Enlarged prostate with lower urinary tract symptoms: Secondary | ICD-10-CM | POA: Diagnosis not present

## 2022-02-08 DIAGNOSIS — N400 Enlarged prostate without lower urinary tract symptoms: Secondary | ICD-10-CM | POA: Diagnosis not present

## 2022-02-08 DIAGNOSIS — D176 Benign lipomatous neoplasm of spermatic cord: Secondary | ICD-10-CM | POA: Diagnosis not present

## 2022-02-08 DIAGNOSIS — K4090 Unilateral inguinal hernia, without obstruction or gangrene, not specified as recurrent: Secondary | ICD-10-CM | POA: Diagnosis not present

## 2022-02-22 DIAGNOSIS — K4090 Unilateral inguinal hernia, without obstruction or gangrene, not specified as recurrent: Secondary | ICD-10-CM | POA: Diagnosis not present

## 2022-02-28 ENCOUNTER — Other Ambulatory Visit: Payer: Self-pay | Admitting: Family Medicine

## 2022-03-15 ENCOUNTER — Other Ambulatory Visit: Payer: Self-pay | Admitting: Family Medicine

## 2022-03-15 DIAGNOSIS — I10 Essential (primary) hypertension: Secondary | ICD-10-CM

## 2022-03-31 DIAGNOSIS — C4441 Basal cell carcinoma of skin of scalp and neck: Secondary | ICD-10-CM | POA: Diagnosis not present

## 2022-03-31 DIAGNOSIS — L821 Other seborrheic keratosis: Secondary | ICD-10-CM | POA: Diagnosis not present

## 2022-03-31 DIAGNOSIS — Z85828 Personal history of other malignant neoplasm of skin: Secondary | ICD-10-CM | POA: Diagnosis not present

## 2022-03-31 DIAGNOSIS — L820 Inflamed seborrheic keratosis: Secondary | ICD-10-CM | POA: Diagnosis not present

## 2022-03-31 DIAGNOSIS — L814 Other melanin hyperpigmentation: Secondary | ICD-10-CM | POA: Diagnosis not present

## 2022-03-31 DIAGNOSIS — L570 Actinic keratosis: Secondary | ICD-10-CM | POA: Diagnosis not present

## 2022-04-07 DIAGNOSIS — N401 Enlarged prostate with lower urinary tract symptoms: Secondary | ICD-10-CM | POA: Diagnosis not present

## 2022-04-07 DIAGNOSIS — R338 Other retention of urine: Secondary | ICD-10-CM | POA: Diagnosis not present

## 2022-04-08 ENCOUNTER — Encounter: Payer: Self-pay | Admitting: Family Medicine

## 2022-04-08 DIAGNOSIS — R5381 Other malaise: Secondary | ICD-10-CM

## 2022-04-08 NOTE — Addendum Note (Signed)
Addended by: Laure Kidney on: 04/08/2022 02:48 PM ? ? Modules accepted: Orders ? ?

## 2022-04-12 ENCOUNTER — Other Ambulatory Visit: Payer: Self-pay | Admitting: Family Medicine

## 2022-04-20 DIAGNOSIS — M6281 Muscle weakness (generalized): Secondary | ICD-10-CM | POA: Diagnosis not present

## 2022-04-20 DIAGNOSIS — R5381 Other malaise: Secondary | ICD-10-CM | POA: Diagnosis not present

## 2022-04-20 DIAGNOSIS — R2681 Unsteadiness on feet: Secondary | ICD-10-CM | POA: Diagnosis not present

## 2022-04-23 DIAGNOSIS — R5381 Other malaise: Secondary | ICD-10-CM | POA: Diagnosis not present

## 2022-04-23 DIAGNOSIS — R2681 Unsteadiness on feet: Secondary | ICD-10-CM | POA: Diagnosis not present

## 2022-04-23 DIAGNOSIS — M6281 Muscle weakness (generalized): Secondary | ICD-10-CM | POA: Diagnosis not present

## 2022-04-26 DIAGNOSIS — R5381 Other malaise: Secondary | ICD-10-CM | POA: Diagnosis not present

## 2022-04-26 DIAGNOSIS — R2681 Unsteadiness on feet: Secondary | ICD-10-CM | POA: Diagnosis not present

## 2022-04-26 DIAGNOSIS — M6281 Muscle weakness (generalized): Secondary | ICD-10-CM | POA: Diagnosis not present

## 2022-04-28 DIAGNOSIS — R5381 Other malaise: Secondary | ICD-10-CM | POA: Diagnosis not present

## 2022-04-28 DIAGNOSIS — R2681 Unsteadiness on feet: Secondary | ICD-10-CM | POA: Diagnosis not present

## 2022-04-28 DIAGNOSIS — M6281 Muscle weakness (generalized): Secondary | ICD-10-CM | POA: Diagnosis not present

## 2022-04-30 DIAGNOSIS — R2681 Unsteadiness on feet: Secondary | ICD-10-CM | POA: Diagnosis not present

## 2022-04-30 DIAGNOSIS — R5381 Other malaise: Secondary | ICD-10-CM | POA: Diagnosis not present

## 2022-04-30 DIAGNOSIS — M6281 Muscle weakness (generalized): Secondary | ICD-10-CM | POA: Diagnosis not present

## 2022-05-03 DIAGNOSIS — R2681 Unsteadiness on feet: Secondary | ICD-10-CM | POA: Diagnosis not present

## 2022-05-03 DIAGNOSIS — M6281 Muscle weakness (generalized): Secondary | ICD-10-CM | POA: Diagnosis not present

## 2022-05-03 DIAGNOSIS — R5381 Other malaise: Secondary | ICD-10-CM | POA: Diagnosis not present

## 2022-05-05 DIAGNOSIS — R2681 Unsteadiness on feet: Secondary | ICD-10-CM | POA: Diagnosis not present

## 2022-05-05 DIAGNOSIS — M6281 Muscle weakness (generalized): Secondary | ICD-10-CM | POA: Diagnosis not present

## 2022-05-05 DIAGNOSIS — Z85828 Personal history of other malignant neoplasm of skin: Secondary | ICD-10-CM | POA: Diagnosis not present

## 2022-05-05 DIAGNOSIS — L821 Other seborrheic keratosis: Secondary | ICD-10-CM | POA: Diagnosis not present

## 2022-05-05 DIAGNOSIS — L820 Inflamed seborrheic keratosis: Secondary | ICD-10-CM | POA: Diagnosis not present

## 2022-05-05 DIAGNOSIS — L570 Actinic keratosis: Secondary | ICD-10-CM | POA: Diagnosis not present

## 2022-05-05 DIAGNOSIS — R5381 Other malaise: Secondary | ICD-10-CM | POA: Diagnosis not present

## 2022-05-05 DIAGNOSIS — L814 Other melanin hyperpigmentation: Secondary | ICD-10-CM | POA: Diagnosis not present

## 2022-05-07 DIAGNOSIS — R5381 Other malaise: Secondary | ICD-10-CM | POA: Diagnosis not present

## 2022-05-07 DIAGNOSIS — M6281 Muscle weakness (generalized): Secondary | ICD-10-CM | POA: Diagnosis not present

## 2022-05-07 DIAGNOSIS — R2681 Unsteadiness on feet: Secondary | ICD-10-CM | POA: Diagnosis not present

## 2022-05-10 DIAGNOSIS — R5381 Other malaise: Secondary | ICD-10-CM | POA: Diagnosis not present

## 2022-05-10 DIAGNOSIS — R2681 Unsteadiness on feet: Secondary | ICD-10-CM | POA: Diagnosis not present

## 2022-05-10 DIAGNOSIS — M6281 Muscle weakness (generalized): Secondary | ICD-10-CM | POA: Diagnosis not present

## 2022-05-12 DIAGNOSIS — R2681 Unsteadiness on feet: Secondary | ICD-10-CM | POA: Diagnosis not present

## 2022-05-12 DIAGNOSIS — M6281 Muscle weakness (generalized): Secondary | ICD-10-CM | POA: Diagnosis not present

## 2022-05-12 DIAGNOSIS — R5381 Other malaise: Secondary | ICD-10-CM | POA: Diagnosis not present

## 2022-05-14 DIAGNOSIS — R2681 Unsteadiness on feet: Secondary | ICD-10-CM | POA: Diagnosis not present

## 2022-05-14 DIAGNOSIS — M6281 Muscle weakness (generalized): Secondary | ICD-10-CM | POA: Diagnosis not present

## 2022-05-14 DIAGNOSIS — R5381 Other malaise: Secondary | ICD-10-CM | POA: Diagnosis not present

## 2022-05-19 DIAGNOSIS — R5381 Other malaise: Secondary | ICD-10-CM | POA: Diagnosis not present

## 2022-05-19 DIAGNOSIS — R2681 Unsteadiness on feet: Secondary | ICD-10-CM | POA: Diagnosis not present

## 2022-05-19 DIAGNOSIS — M6281 Muscle weakness (generalized): Secondary | ICD-10-CM | POA: Diagnosis not present

## 2022-05-21 DIAGNOSIS — M6281 Muscle weakness (generalized): Secondary | ICD-10-CM | POA: Diagnosis not present

## 2022-05-21 DIAGNOSIS — R5381 Other malaise: Secondary | ICD-10-CM | POA: Diagnosis not present

## 2022-05-21 DIAGNOSIS — R2681 Unsteadiness on feet: Secondary | ICD-10-CM | POA: Diagnosis not present

## 2022-05-24 DIAGNOSIS — R5381 Other malaise: Secondary | ICD-10-CM | POA: Diagnosis not present

## 2022-05-24 DIAGNOSIS — R2681 Unsteadiness on feet: Secondary | ICD-10-CM | POA: Diagnosis not present

## 2022-05-24 DIAGNOSIS — M6281 Muscle weakness (generalized): Secondary | ICD-10-CM | POA: Diagnosis not present

## 2022-05-31 ENCOUNTER — Other Ambulatory Visit: Payer: Self-pay | Admitting: Family Medicine

## 2022-05-31 DIAGNOSIS — I700 Atherosclerosis of aorta: Secondary | ICD-10-CM

## 2022-05-31 DIAGNOSIS — M6281 Muscle weakness (generalized): Secondary | ICD-10-CM | POA: Diagnosis not present

## 2022-05-31 DIAGNOSIS — R2681 Unsteadiness on feet: Secondary | ICD-10-CM | POA: Diagnosis not present

## 2022-05-31 DIAGNOSIS — R5381 Other malaise: Secondary | ICD-10-CM | POA: Diagnosis not present

## 2022-06-02 DIAGNOSIS — R2681 Unsteadiness on feet: Secondary | ICD-10-CM | POA: Diagnosis not present

## 2022-06-02 DIAGNOSIS — R5381 Other malaise: Secondary | ICD-10-CM | POA: Diagnosis not present

## 2022-06-02 DIAGNOSIS — M6281 Muscle weakness (generalized): Secondary | ICD-10-CM | POA: Diagnosis not present

## 2022-06-04 DIAGNOSIS — R5381 Other malaise: Secondary | ICD-10-CM | POA: Diagnosis not present

## 2022-06-04 DIAGNOSIS — M6281 Muscle weakness (generalized): Secondary | ICD-10-CM | POA: Diagnosis not present

## 2022-06-04 DIAGNOSIS — R2681 Unsteadiness on feet: Secondary | ICD-10-CM | POA: Diagnosis not present

## 2022-06-07 DIAGNOSIS — R2681 Unsteadiness on feet: Secondary | ICD-10-CM | POA: Diagnosis not present

## 2022-06-07 DIAGNOSIS — R5381 Other malaise: Secondary | ICD-10-CM | POA: Diagnosis not present

## 2022-06-07 DIAGNOSIS — M6281 Muscle weakness (generalized): Secondary | ICD-10-CM | POA: Diagnosis not present

## 2022-06-09 DIAGNOSIS — M6281 Muscle weakness (generalized): Secondary | ICD-10-CM | POA: Diagnosis not present

## 2022-06-09 DIAGNOSIS — R2681 Unsteadiness on feet: Secondary | ICD-10-CM | POA: Diagnosis not present

## 2022-06-09 DIAGNOSIS — R5381 Other malaise: Secondary | ICD-10-CM | POA: Diagnosis not present

## 2022-07-07 ENCOUNTER — Other Ambulatory Visit (INDEPENDENT_AMBULATORY_CARE_PROVIDER_SITE_OTHER): Payer: Medicare Other

## 2022-07-07 DIAGNOSIS — R7303 Prediabetes: Secondary | ICD-10-CM | POA: Diagnosis not present

## 2022-07-07 DIAGNOSIS — M1A00X0 Idiopathic chronic gout, unspecified site, without tophus (tophi): Secondary | ICD-10-CM

## 2022-07-07 DIAGNOSIS — I700 Atherosclerosis of aorta: Secondary | ICD-10-CM

## 2022-07-07 LAB — LIPID PANEL
Cholesterol: 139 mg/dL (ref 0–200)
HDL: 55.6 mg/dL (ref >39.00)
LDL Cholesterol: 67 mg/dL (ref 0–99)
NonHDL: 83.67
Total CHOL/HDL Ratio: 3
Triglycerides: 85 mg/dL (ref 0.0–149.0)
VLDL: 17 mg/dL (ref 0.0–40.0)

## 2022-07-07 LAB — COMPREHENSIVE METABOLIC PANEL
ALT: 9 U/L (ref 0–53)
AST: 14 U/L (ref 0–37)
Albumin: 4.2 g/dL (ref 3.5–5.2)
Alkaline Phosphatase: 57 U/L (ref 39–117)
BUN: 27 mg/dL — ABNORMAL HIGH (ref 6–23)
CO2: 29 mEq/L (ref 19–32)
Calcium: 9.3 mg/dL (ref 8.4–10.5)
Chloride: 102 mEq/L (ref 96–112)
Creatinine, Ser: 1.91 mg/dL — ABNORMAL HIGH (ref 0.40–1.50)
GFR: 31.08 mL/min — ABNORMAL LOW (ref >60.00)
Glucose, Bld: 99 mg/dL (ref 70–99)
Potassium: 4.9 mEq/L (ref 3.5–5.1)
Sodium: 137 mEq/L (ref 135–145)
Total Bilirubin: 0.9 mg/dL (ref 0.2–1.2)
Total Protein: 6.6 g/dL (ref 6.0–8.3)

## 2022-07-07 LAB — URIC ACID: Uric Acid, Serum: 6.6 mg/dL (ref 4.0–7.8)

## 2022-07-07 LAB — HEMOGLOBIN A1C: Hgb A1c MFr Bld: 6 % (ref 4.6–6.5)

## 2022-07-14 ENCOUNTER — Encounter: Payer: Self-pay | Admitting: Family Medicine

## 2022-07-14 ENCOUNTER — Ambulatory Visit (INDEPENDENT_AMBULATORY_CARE_PROVIDER_SITE_OTHER): Payer: Medicare Other | Admitting: Family Medicine

## 2022-07-14 VITALS — BP 114/64 | HR 86 | Temp 97.6°F | Ht 72.0 in | Wt 186.4 lb

## 2022-07-14 DIAGNOSIS — N1832 Chronic kidney disease, stage 3b: Secondary | ICD-10-CM

## 2022-07-14 DIAGNOSIS — E785 Hyperlipidemia, unspecified: Secondary | ICD-10-CM | POA: Diagnosis not present

## 2022-07-14 DIAGNOSIS — I10 Essential (primary) hypertension: Secondary | ICD-10-CM

## 2022-07-14 DIAGNOSIS — M1A09X0 Idiopathic chronic gout, multiple sites, without tophus (tophi): Secondary | ICD-10-CM | POA: Diagnosis not present

## 2022-07-14 MED ORDER — DAPAGLIFLOZIN PROPANEDIOL 10 MG PO TABS: 10.0000 mg | ORAL_TABLET | Freq: Every day | ORAL | 3 refills | Status: DC

## 2022-07-14 MED ORDER — DAPAGLIFLOZIN PROPANEDIOL 10 MG PO TABS: 10.0000 mg | ORAL_TABLET | Freq: Every day | ORAL | 2 refills | Status: DC

## 2022-07-14 NOTE — Progress Notes (Signed)
Chief Complaint  Patient presents with   Follow-up    Subjective Steven Pearson is a 88 y.o. male who presents for hypertension follow up. He does monitor home blood pressures. Blood pressures ranging from 120's/80's on average. He is compliant with medications- telmisartan 40 mg/d, HCTZ 12.5 mg/d. Patient has these side effects of medication: none He is adhering to a healthy diet overall. Current exercise: walking No Cp or SOB.  Hyperlipidemia Patient presents for dyslipidemia follow up. Currently being treated with Lipitor 40 mg/d and compliance with treatment thus far has been good. He denies myalgias. Diet/exercise as above.  The patient is not known to have coexisting coronary artery disease.   Past Medical History:  Diagnosis Date   BPH (benign prostatic hypertrophy)    GERD (gastroesophageal reflux disease)    HOH (hard of hearing)    both ears   Hyperlipidemia    Hypertension    Normal cardiac stress test 11/28/13   Treadmill   PVC (premature ventricular contraction)     Exam BP 114/64   Pulse 86   Temp 97.6 F (36.4 C) (Oral)   Ht 6' (1.829 m)   Wt 186 lb 6 oz (84.5 kg)   SpO2 96%   BMI 25.28 kg/m  General:  well developed, well nourished, in no apparent distress Heart: RRR, no bruits, 2+ pitting b/l LE edema tapering at mid tibia Lungs: clear to auscultation, no accessory muscle use Psych: well oriented with normal range of affect and appropriate judgment/insight  Stage 3b chronic kidney disease ([name]) - Plan: dapagliflozin propanediol (FARXIGA) 10 MG TABS tablet, Basic metabolic panel, DISCONTINUED: dapagliflozin propanediol (FARXIGA) 10 MG TABS tablet  Essential hypertension, benign  Hyperlipidemia, unspecified hyperlipidemia type  Chronic gout of multiple sites, unspecified cause  Cont ARB, add SGLT-2, reck BMP in 1 mo. Chronic, stable. Cont telmisartan 40 mg/d, HCTZ 12.5 mg/d. Counseled on diet and exercise. Chronic, stable. Cont Lipitor 40  mg/d. F/u in 6 mo for medication check. The patient voiced understanding and agreement to the plan.  [place], DO 07/14/22  12:14 PM

## 2022-07-14 NOTE — Patient Instructions (Addendum)
Continue to stay hydrated

## 2022-07-15 ENCOUNTER — Encounter: Payer: Self-pay | Admitting: Family Medicine

## 2022-07-17 ENCOUNTER — Other Ambulatory Visit: Payer: Self-pay | Admitting: Family Medicine

## 2022-07-19 NOTE — Telephone Encounter (Signed)
Pt is very frustrated with his "level of care". He states he feels like no one is taking his medical care seriously and feels this message is dire and PA needs to get done. He feels his medical needs are not being met and feels like he is in the middle of his care and his insurance. It was explained the PA has been submitted because we are trying to get this covered and it is up to his ins to get back to [place] and we are all working as a team to get this handled for him. Advised him another nurse that helps with PA's Vilma Prader) will resubmit this for him tomorrow morning.

## 2022-07-19 NOTE — Telephone Encounter (Signed)
Patient called to get an update on the PA for his medication, informed patient that we had not received anything yet but a message was sent to CMA to see what's going on. Patient stated he wants to start this medication right away and would like for it to be sent to [facility] on [address] instead of the mail in order so he can start it right away. He also stated he would like a call to know when it's done so he doesn't have to call us back. Please advise.

## 2022-07-19 NOTE — Telephone Encounter (Signed)
Pt called stating that [facility] had not received anything from [place] regarding the PA for the [place] and they had closed the case because of it. Pt expressed how disappointed he was in how this process has gone and he hadn't been updated as to what was going on. Pt would like [place] to reach out to [facility] to reopen this case and get this PA sorted to get him his medication at [phone]. Going forward, pt would like updates as to the status of the PA and where we stand on getting it done.

## 2022-07-20 ENCOUNTER — Telehealth: Payer: Self-pay

## 2022-07-20 MED ORDER — EMPAGLIFLOZIN 25 MG PO TABS: 25.0000 mg | ORAL_TABLET | Freq: Every day | ORAL | 3 refills | Status: DC

## 2022-07-20 NOTE — Telephone Encounter (Signed)
PA initiated via Covermymeds; KEY: [id number]. PA approved.   Effective 06/20/22 to 07/20/23.

## 2022-07-20 NOTE — Telephone Encounter (Signed)
Pt called and advised of message below.

## 2022-07-20 NOTE — Telephone Encounter (Signed)
PA for Jardiance approved. See other telephone note.

## 2022-07-20 NOTE — Telephone Encounter (Signed)
Wilder Glade not covered by Pt's plan. Preferred alternatives:   ALOGLIPTIN Not Required [facility] Not Required JANUMET Not Required JARDIANCE Not Required [facility] Not Required QTERN Not Required RYBELSUS Not Required XIGDUO XR Not Required

## 2022-07-20 NOTE — Telephone Encounter (Signed)
Patient is frustrated that he has had to make a 6th call to our office. He called the pharmacy and was advised that [place] is requesting a prior authorization for the new medication that has been called in. He wanted to speak to someone who can get it done and get it done right. Explained to patient that since his new medication is not being approved by his insurance, a new PA is going to be sent for it which would be the same process as the prior medication they did not accept. Patient stated he wants someone to call his insurance and get it approved right away because we are not doing it fast enough. Informed patient that CMAs are working with patients among other PAs that they have to get done but are working as fast as they can to get his medicine for him but unfortunately there are steps that have to be done when medications are not approved. Patient stated he did not believe we were trying to help him and that if it wasn't because of him nothing would get done. Patient continued to express he was very unhappy with [place] and not working fast enough to get him the medicine he needs. Patient would like for CMA to call and talk to his insurance [phone]. Informed patient that as soon as CMA was able she would call. Please advise.

## 2022-07-20 NOTE — Telephone Encounter (Signed)
Telephone note in chart in regards to medication coverage

## 2022-07-20 NOTE — Telephone Encounter (Signed)
Please let pt know we sent in another medicine in the same class that is covered by his insurance. Ty.

## 2022-07-20 NOTE — Telephone Encounter (Signed)
Kristopher Oppenheim informed of approval.

## 2022-07-25 ENCOUNTER — Encounter: Payer: Self-pay | Admitting: Family Medicine

## 2022-07-26 ENCOUNTER — Other Ambulatory Visit: Payer: Self-pay | Admitting: Family Medicine

## 2022-07-26 MED ORDER — EMPAGLIFLOZIN 10 MG PO TABS: 10.0000 mg | ORAL_TABLET | Freq: Every day | ORAL | 2 refills | Status: DC

## 2022-07-27 ENCOUNTER — Other Ambulatory Visit: Payer: Self-pay | Admitting: Family Medicine

## 2022-07-27 ENCOUNTER — Encounter: Payer: Self-pay | Admitting: Family Medicine

## 2022-07-27 DIAGNOSIS — N1832 Chronic kidney disease, stage 3b: Secondary | ICD-10-CM

## 2022-08-03 DIAGNOSIS — N1832 Chronic kidney disease, stage 3b: Secondary | ICD-10-CM | POA: Diagnosis not present

## 2022-08-03 DIAGNOSIS — R7989 Other specified abnormal findings of blood chemistry: Secondary | ICD-10-CM | POA: Diagnosis not present

## 2022-08-03 DIAGNOSIS — R3129 Other microscopic hematuria: Secondary | ICD-10-CM | POA: Diagnosis not present

## 2022-08-03 DIAGNOSIS — R801 Persistent proteinuria, unspecified: Secondary | ICD-10-CM | POA: Diagnosis not present

## 2022-08-03 DIAGNOSIS — E559 Vitamin D deficiency, unspecified: Secondary | ICD-10-CM | POA: Diagnosis not present

## 2022-08-04 ENCOUNTER — Encounter: Payer: Self-pay | Admitting: Family Medicine

## 2022-08-04 NOTE — Telephone Encounter (Signed)
Patient called to follow up on his mychart message regarding labs. Relayed the message that Dr. Nani Ravens would like to give it a month before labs so he can move it up a little bit if he wants. Patient requested to move it to the 16th so appt changed.

## 2022-08-11 ENCOUNTER — Other Ambulatory Visit: Payer: Medicare Other

## 2022-08-11 ENCOUNTER — Other Ambulatory Visit (INDEPENDENT_AMBULATORY_CARE_PROVIDER_SITE_OTHER): Payer: Medicare Other

## 2022-08-11 DIAGNOSIS — N1832 Chronic kidney disease, stage 3b: Secondary | ICD-10-CM

## 2022-08-11 DIAGNOSIS — N179 Acute kidney failure, unspecified: Secondary | ICD-10-CM | POA: Diagnosis not present

## 2022-08-11 DIAGNOSIS — N281 Cyst of kidney, acquired: Secondary | ICD-10-CM | POA: Diagnosis not present

## 2022-08-11 LAB — BASIC METABOLIC PANEL
BUN: 28 mg/dL — ABNORMAL HIGH (ref 6–23)
CO2: 28 mEq/L (ref 19–32)
Calcium: 9.1 mg/dL (ref 8.4–10.5)
Chloride: 101 mEq/L (ref 96–112)
Creatinine, Ser: 1.89 mg/dL — ABNORMAL HIGH (ref 0.40–1.50)
GFR: 31.46 mL/min — ABNORMAL LOW (ref >60.00)
Glucose, Bld: 99 mg/dL (ref 70–99)
Potassium: 4 mEq/L (ref 3.5–5.1)
Sodium: 137 mEq/L (ref 135–145)

## 2022-08-14 ENCOUNTER — Other Ambulatory Visit: Payer: Self-pay | Admitting: Family Medicine

## 2022-08-14 DIAGNOSIS — I10 Essential (primary) hypertension: Secondary | ICD-10-CM

## 2022-08-16 ENCOUNTER — Other Ambulatory Visit: Payer: Medicare Other

## 2022-08-16 ENCOUNTER — Other Ambulatory Visit: Payer: Self-pay | Admitting: Family Medicine

## 2022-08-16 MED ORDER — EMPAGLIFLOZIN 10 MG PO TABS: 10.0000 mg | ORAL_TABLET | Freq: Every day | ORAL | 2 refills | Status: DC

## 2022-09-01 DIAGNOSIS — N179 Acute kidney failure, unspecified: Secondary | ICD-10-CM | POA: Diagnosis not present

## 2022-09-16 ENCOUNTER — Telehealth: Payer: Self-pay | Admitting: Family Medicine

## 2022-09-16 NOTE — Telephone Encounter (Signed)
Left message for patient to call back and schedule Medicare Annual Wellness Visit (AWV).   Please offer to do virtually or by telephone.  Left office number and my jabber [phone].  Last AWV:09/02/2021  Please schedule at anytime with Nurse Health Advisor.

## 2022-10-18 ENCOUNTER — Ambulatory Visit (INDEPENDENT_AMBULATORY_CARE_PROVIDER_SITE_OTHER): Payer: Medicare Other

## 2022-10-18 VITALS — Wt 180.0 lb

## 2022-10-18 DIAGNOSIS — Z0000 Encounter for general adult medical examination without abnormal findings: Secondary | ICD-10-CM | POA: Diagnosis not present

## 2022-10-18 NOTE — Patient Instructions (Signed)
Steven Pearson , Thank you for taking time to come for your Medicare Wellness Visit. I appreciate your ongoing commitment to your health goals. Please review the following plan we discussed and let me know if I can assist you in the future.   These are the goals we discussed:  Goals      Patient Stated     Maintain current healthy active lifestyle     Patient Stated     Maintain health         This is a list of the screening recommended for you and due dates:  Health Maintenance  Topic Date Due   Tetanus Vaccine  08/20/2024*   COVID-19 Vaccine (6 - Moderna series) 02/13/2023   Pneumonia Vaccine  Completed   Flu Shot  Completed   HPV Vaccine  Aged Out   Zoster (Shingles) Vaccine  Discontinued  *Topic was postponed. The date shown is not the original due date.    Advanced directives: Please bring a copy of your health care power of attorney and living will to the office at your convenience.  Conditions/risks identified: maintain health   Next appointment: Follow up in one year for your annual wellness visit.   Preventive Care 5 Years and Older, Male  Preventive care refers to lifestyle choices and visits with your health care provider that can promote health and wellness. What does preventive care include? A yearly physical exam. This is also called an annual well check. Dental exams once or twice a year. Routine eye exams. Ask your health care provider how often you should have your eyes checked. Personal lifestyle choices, including: Daily care of your teeth and gums. Regular physical activity. Eating a healthy diet. Avoiding tobacco and drug use. Limiting alcohol use. Practicing safe sex. Taking low doses of aspirin every day. Taking vitamin and mineral supplements as recommended by your health care provider. What happens during an annual well check? The services and screenings done by your health care provider during your annual well check will depend on your age,  overall health, lifestyle risk factors, and family history of disease. Counseling  Your health care provider may ask you questions about your: Alcohol use. Tobacco use. Drug use. Emotional well-being. Home and relationship well-being. Sexual activity. Eating habits. History of falls. Memory and ability to understand (cognition). Work and work Statistician. Screening  You may have the following tests or measurements: Height, weight, and BMI. Blood pressure. Lipid and cholesterol levels. These may be checked every 5 years, or more frequently if you are over 58 years old. Skin check. Lung cancer screening. You may have this screening every year starting at age 48 if you have a 30-pack-year history of smoking and currently smoke or have quit within the past 15 years. Fecal occult blood test (FOBT) of the stool. You may have this test every year starting at age 77. Flexible sigmoidoscopy or colonoscopy. You may have a sigmoidoscopy every 5 years or a colonoscopy every 10 years starting at age 33. Prostate cancer screening. Recommendations will vary depending on your family history and other risks. Hepatitis C blood test. Hepatitis B blood test. Sexually transmitted disease (STD) testing. Diabetes screening. This is done by checking your blood sugar (glucose) after you have not eaten for a while (fasting). You may have this done every 1-3 years. Abdominal aortic aneurysm (AAA) screening. You may need this if you are a current or former smoker. Osteoporosis. You may be screened starting at age 65 if you are  at high risk. Talk with your health care provider about your test results, treatment options, and if necessary, the need for more tests. Vaccines  Your health care provider may recommend certain vaccines, such as: Influenza vaccine. This is recommended every year. Tetanus, diphtheria, and acellular pertussis (Tdap, Td) vaccine. You may need a Td booster every 10 years. Zoster vaccine.  You may need this after age 74. Pneumococcal 13-valent conjugate (PCV13) vaccine. One dose is recommended after age 11. Pneumococcal polysaccharide (PPSV23) vaccine. One dose is recommended after age 56. Talk to your health care provider about which screenings and vaccines you need and how often you need them. This information is not intended to replace advice given to you by your health care provider. Make sure you discuss any questions you have with your health care provider. Document Released: 01/09/2016 Document Revised: 09/01/2016 Document Reviewed: 10/14/2015 Elsevier Interactive Patient Education  2017 [place] Prevention in the Home Falls can cause injuries. They can happen to people of all ages. There are many things you can do to make your home safe and to help prevent falls. What can I do on the outside of my home? Regularly fix the edges of walkways and driveways and fix any cracks. Remove anything that might make you trip as you walk through a door, such as a raised step or threshold. Trim any bushes or trees on the path to your home. Use bright outdoor lighting. Clear any walking paths of anything that might make someone trip, such as rocks or tools. Regularly check to see if handrails are loose or broken. Make sure that both sides of any steps have handrails. Any raised decks and porches should have guardrails on the edges. Have any leaves, snow, or ice cleared regularly. Use sand or salt on walking paths during winter. Clean up any spills in your garage right away. This includes oil or grease spills. What can I do in the bathroom? Use night lights. Install grab bars by the toilet and in the tub and shower. Do not use towel bars as grab bars. Use non-skid mats or decals in the tub or shower. If you need to sit down in the shower, use a plastic, non-slip stool. Keep the floor dry. Clean up any water that spills on the floor as soon as it happens. Remove soap  buildup in the tub or shower regularly. Attach bath mats securely with double-sided non-slip rug tape. Do not have throw rugs and other things on the floor that can make you trip. What can I do in the bedroom? Use night lights. Make sure that you have a light by your bed that is easy to reach. Do not use any sheets or blankets that are too big for your bed. They should not hang down onto the floor. Have a firm chair that has side arms. You can use this for support while you get dressed. Do not have throw rugs and other things on the floor that can make you trip. What can I do in the kitchen? Clean up any spills right away. Avoid walking on wet floors. Keep items that you use a lot in easy-to-reach places. If you need to reach something above you, use a strong step stool that has a grab bar. Keep electrical cords out of the way. Do not use floor polish or wax that makes floors slippery. If you must use wax, use non-skid floor wax. Do not have throw rugs and other things on the floor  that can make you trip. What can I do with my stairs? Do not leave any items on the stairs. Make sure that there are handrails on both sides of the stairs and use them. Fix handrails that are broken or loose. Make sure that handrails are as long as the stairways. Check any carpeting to make sure that it is firmly attached to the stairs. Fix any carpet that is loose or worn. Avoid having throw rugs at the top or bottom of the stairs. If you do have throw rugs, attach them to the floor with carpet tape. Make sure that you have a light switch at the top of the stairs and the bottom of the stairs. If you do not have them, ask someone to add them for you. What else can I do to help prevent falls? Wear shoes that: Do not have high heels. Have rubber bottoms. Are comfortable and fit you well. Are closed at the toe. Do not wear sandals. If you use a stepladder: Make sure that it is fully opened. Do not climb a closed  stepladder. Make sure that both sides of the stepladder are locked into place. Ask someone to hold it for you, if possible. Clearly mark and make sure that you can see: Any grab bars or handrails. First and last steps. Where the edge of each step is. Use tools that help you move around (mobility aids) if they are needed. These include: Canes. Walkers. Scooters. Crutches. Turn on the lights when you go into a dark area. Replace any light bulbs as soon as they burn out. Set up your furniture so you have a clear path. Avoid moving your furniture around. If any of your floors are uneven, fix them. If there are any pets around you, be aware of where they are. Review your medicines with your doctor. Some medicines can make you feel dizzy. This can increase your chance of falling. Ask your doctor what other things that you can do to help prevent falls. This information is not intended to replace advice given to you by your health care provider. Make sure you discuss any questions you have with your health care provider. Document Released: 10/09/2009 Document Revised: 05/20/2016 Document Reviewed: 01/17/2015 Elsevier Interactive Patient Education  2017 [employer].

## 2022-10-18 NOTE — Progress Notes (Addendum)
I connected with  Maren Reamer on 10/18/22 by a audio enabled telemedicine application and verified that I am speaking with the correct person using two identifiers.  Patient Location: Home  Provider Location: Office/Clinic  I discussed the limitations of evaluation and management by telemedicine. The patient expressed understanding and agreed to proceed.   Subjective:   Steven Pearson is a 88 y.o. male who presents for Medicare Annual/Subsequent preventive examination.  Review of Systems     Cardiac Risk Factors include: advanced age (>65mn, >>8women);male gender;dyslipidemia;hypertension     Objective:    Today's Vitals   10/18/22 0934  Weight: 180 lb (81.6 kg)   Body mass index is 24.41 kg/m.     10/18/2022    9:38 AM 01/24/2022   11:36 AM 09/02/2021    9:04 AM 03/06/2018    8:00 PM 03/01/2018   10:36 AM 05/14/2014   11:32 AM  Advanced Directives  Does Patient Have a Medical Advance Directive? Yes Yes Yes Yes Yes Patient has advance directive, copy not in chart;Patient would not like information  Type of Advance Directive [place] will   Does patient want to make changes to medical advance directive?    No - Patient declined No - Patient declined   Copy of [place] Chart? No - copy requested  No - copy requested No - copy requested No - copy requested     Current Medications (verified) Outpatient Encounter Medications as of 10/18/2022  Medication Sig   allopurinol (ZYLOPRIM) 100 MG tablet TAKE 1 TABLET DAILY   atorvastatin (LIPITOR) 40 MG tablet TAKE 1 TABLET DAILY   empagliflozin (JARDIANCE) 10 MG TABS tablet Take 1 tablet (10 mg total) by mouth daily before breakfast.   hydrochlorothiazide (HYDRODIURIL) 12.5 MG tablet TAKE 1 TABLET DAILY   NON FORMULARY Take 120 mLs by mouth daily. Kefir probiotic  otc   omeprazole (PRILOSEC) 10 MG capsule TAKE 1 CAPSULE DAILY   telmisartan (MICARDIS) 40 MG tablet TAKE 1 TABLET DAILY   No facility-administered encounter medications on file as of 10/18/2022.    Allergies (verified) Nitrofuran derivatives   History: Past Medical History:  Diagnosis Date   BPH (benign prostatic hypertrophy)    GERD (gastroesophageal reflux disease)    HOH (hard of hearing)    both ears   Hyperlipidemia    Hypertension    Normal cardiac stress test 11/28/13   Treadmill   PVC (premature ventricular contraction)    Past Surgical History:  Procedure Laterality Date   basal cell skin areas removed     more than 1 removed   CATARACT EXTRACTION, BILATERAL     left eye poor vision now   CYSTOSCOPY N/A 03/06/2018   Procedure: CYSTOSCOPY;  Surgeon: BRaynelle Bring MD;  Location: [facility];  Service: Urology;  Laterality: N/A;   HAND SURGERY     right   squamous cell skin pre cancer areas removed     x 1   TONSILLECTOMY     TRANSURETHRAL RESECTION OF PROSTATE N/A 03/06/2018   Procedure: TRANSURETHRAL RESECTION OF THE PROSTATE (TURP);  Surgeon: BRaynelle Bring MD;  Location: [facility];  Service: Urology;  Laterality: N/A;   TRANSURETHRAL RESECTION OF PROSTATE  01/06/2021   Family History  Problem Relation Age of Onset   Heart Problems Mother    Heart disease Father  Pacemaker   Social History   Socioeconomic History   Marital status: Widowed    Spouse name: Not on file   Number of children: 2   Years of education: Not on file   Highest education level: Not on file  Occupational History   Not on file  Tobacco Use   Smoking status: Former    Packs/day: 1.50    Years: 45.00    Total pack years: 67.50    Types: Cigarettes    Quit date: 08/17/1999    Years since quitting: 23.1   Smokeless tobacco: Never  Vaping Use   Vaping Use: Never used  Substance and Sexual Activity   Alcohol use: Yes    Alcohol/week: 5.0 standard drinks of alcohol    Types: 5  Glasses of wine per week    Comment: glass of wine per day   Drug use: No   Sexual activity: Not Currently  Other Topics Concern   Not on file  Social History Narrative   Not on file   Social Determinants of Health   Financial Resource Strain: Low Risk  (10/18/2022)   Overall Financial Resource Strain (CARDIA)    Difficulty of Paying Living Expenses: Not hard at all  Food Insecurity: No Food Insecurity (10/18/2022)   Hunger Vital Sign    Worried About Running Out of Food in the Last Year: Never true    Ran Out of Food in the Last Year: Never true  Transportation Needs: No Transportation Needs (10/18/2022)   PRAPARE - Hydrologist (Medical): No    Lack of Transportation (Non-Medical): No  Physical Activity: Inactive (10/18/2022)   Exercise Vital Sign    Days of Exercise per Week: 0 days    Minutes of Exercise per Session: 0 min  Stress: No Stress Concern Present (10/18/2022)   [place]    Feeling of Stress : Not at all  Social Connections: Socially Isolated (10/18/2022)   Social Connection and Isolation Panel [NHANES]    Frequency of Communication with Friends and Family: More than three times a week    Frequency of Social Gatherings with Friends and Family: More than three times a week    Attends Religious Services: Never    Marine scientist or Organizations: No    Attends Archivist Meetings: Never    Marital Status: Widowed    Tobacco Counseling Counseling given: Not Answered   Clinical Intake:  Pre-visit preparation completed: Yes  Pain : No/denies pain     BMI - recorded: 24.41 Nutritional Status: BMI of 19-24  Normal Nutritional Risks: None Diabetes: No  How often do you need to have someone help you when you read instructions, pamphlets, or other written materials from your doctor or pharmacy?: 1 - Never  Diabetic?no  Interpreter Needed?:  No  Information entered by :: Charlott Rakes, LPN   Activities of Daily Living    10/18/2022    9:39 AM  In your present state of health, do you have any difficulty performing the following activities:  Hearing? 0  Vision? 0  Difficulty concentrating or making decisions? 0  Walking or climbing stairs? 0  Comment avoid stairs  Dressing or bathing? 0  Doing errands, shopping? 0  Preparing Food and eating ? N  Using the Toilet? N  In the past six months, have you accidently leaked urine? N  Do you have problems with loss of bowel control?  N  Managing your Medications? N  Managing your Finances? N  Housekeeping or managing your Housekeeping? N    Patient Care Team: Shelda Pal, DO as PCP - General (Family Medicine)  Indicate any recent Medical Services you may have received from other than [facility] providers in the past year (date may be approximate).     Assessment:   This is a routine wellness examination for Pranav.  Hearing/Vision screen Hearing Screening - Comments:: Pt has slight loss  Vision Screening - Comments:: Pt follows up with [place] provider   Dietary issues and exercise activities discussed: Current Exercise Habits: The patient does not participate in regular exercise at present   Goals Addressed             This Visit's Progress    Patient Stated       Maintain health        Depression Screen    10/18/2022    9:37 AM 01/11/2022    9:14 AM 09/02/2021    9:10 AM 07/20/2021    9:06 AM 08/20/2020    8:11 AM 10/05/2017   10:21 AM 04/14/2016    9:58 AM  PHQ 2/9 Scores  PHQ - 2 Score 0 0 0 0 0 0 0    Fall Risk    10/18/2022    9:39 AM 01/11/2022    9:13 AM 09/02/2021    9:08 AM 07/20/2021    9:06 AM 11/21/2019    9:46 AM  Fall Risk   Falls in the past year? 0 0 0 0 0  Comment     Emmi Telephone Survey: data to providers prior to load  Number falls in past yr: 0 0 0 0   Injury with Fall? 0 0 0 0   Risk for fall due to : Impaired  vision No Fall Risks  No Fall Risks   Follow up Falls prevention discussed Falls evaluation completed Falls prevention discussed Falls evaluation completed     FALL RISK PREVENTION PERTAINING TO THE HOME:  Any stairs in or around the home? No  If so, are there any without handrails? No  Home free of loose throw rugs in walkways, pet beds, electrical cords, etc? Yes  Adequate lighting in your home to reduce risk of falls? Yes   ASSISTIVE DEVICES UTILIZED TO PREVENT FALLS:  Life alert? No  Use of a cane, walker or w/c? No  Grab bars in the bathroom? Yes  Shower chair or bench in shower? No  Elevated toilet seat or a handicapped toilet? No   TIMED UP AND GO:  Was the test performed? No .    Cognitive Function:        10/18/2022    9:40 AM 10/05/2017   10:22 AM  6CIT Screen  What Year? 0 points 0 points  What month? 0 points 0 points  What time? 0 points 0 points  Count back from 20 0 points 0 points  Months in reverse 0 points 0 points  Repeat phrase 0 points 4 points  Total Score 0 points 4 points    Immunizations Immunization History  Administered Date(s) Administered   Influenza Split 09/20/2012, 09/24/2013   Influenza, High Dose Seasonal PF 10/05/2017, 09/20/2018, 09/26/2019   Influenza,inj,Quad PF,6+ Mos 09/24/2016   Influenza-Unspecified 09/20/2012, 09/24/2013, 09/26/2014, 09/27/2015, 09/24/2016, 09/26/2018, 11/11/2021, 10/13/2022   Moderna Covid-19 Vaccine Bivalent Booster 40yr & up 10/13/2022   Moderna Sars-Covid-2 Vaccination 01/11/2020, 02/11/2020, 10/29/2020, 07/08/2021   Pneumococcal Conjugate-13 10/05/2017  Pneumococcal Polysaccharide-23 09/20/2012    TDAP status: Due, Education has been provided regarding the importance of this vaccine. Advised may receive this vaccine at local pharmacy or Health Dept. Aware to provide a copy of the vaccination record if obtained from local pharmacy or Health Dept. Verbalized acceptance and understanding.  Flu  Vaccine status: Up to date  Pneumococcal vaccine status: Up to date  Covid-19 vaccine status: Completed vaccines  Qualifies for Shingles Vaccine? No   Zostavax completed No   Shingrix Completed?: No.    Education has been provided regarding the importance of this vaccine. Patient has been advised to call insurance company to determine out of pocket expense if they have not yet received this vaccine. Advised may also receive vaccine at local pharmacy or Health Dept. Verbalized acceptance and understanding.  Screening Tests Health Maintenance  Topic Date Due   TETANUS/TDAP  08/20/2024 (Originally 11/26/2019)   COVID-19 Vaccine (6 - Moderna series) 02/13/2023   Pneumonia Vaccine 70+ Years old  Completed   INFLUENZA VACCINE  Completed   HPV VACCINES  Aged Out   Zoster Vaccines- Shingrix  Discontinued    Health Maintenance  There are no preventive care reminders to display for this patient.   Colorectal cancer screening: No longer required.    Additional Screening:   Vision Screening: Recommended annual ophthalmology exams for early detection of glaucoma and other disorders of the eye. Is the patient up to date with their annual eye exam?  Yes  Who is the provider or what is the name of the office in which the patient attends annual eye exams? [facility] provider  If pt is not established with a provider, would they like to be referred to a provider to establish care? No .   Dental Screening: Recommended annual dental exams for proper oral hygiene  Community Resource Referral / Chronic Care Management: CRR required this visit?  No   CCM required this visit?  No      Plan:     I have personally reviewed and noted the following in the patient's chart:   Medical and social history Use of alcohol, tobacco or illicit drugs  Current medications and supplements including opioid prescriptions. Patient is not currently taking opioid prescriptions. Functional ability and  status Nutritional status Physical activity Advanced directives List of other physicians Hospitalizations, surgeries, and ER visits in previous 12 months Vitals Screenings to include cognitive, depression, and falls Referrals and appointments  In addition, I have reviewed and discussed with patient certain preventive protocols, quality metrics, and best practice recommendations. A written personalized care plan for preventive services as well as general preventive health recommendations were provided to patient.     Willette Brace, LPN   [id number]   Nurse Notes: none

## 2022-11-21 ENCOUNTER — Other Ambulatory Visit: Payer: Self-pay | Admitting: Family Medicine

## 2022-11-25 ENCOUNTER — Encounter: Payer: Self-pay | Admitting: Family Medicine

## 2022-11-25 DIAGNOSIS — R5381 Other malaise: Secondary | ICD-10-CM

## 2022-11-25 NOTE — Telephone Encounter (Signed)
Referral placed.

## 2022-12-04 ENCOUNTER — Other Ambulatory Visit: Payer: Self-pay | Admitting: Family Medicine

## 2022-12-07 DIAGNOSIS — M6281 Muscle weakness (generalized): Secondary | ICD-10-CM | POA: Diagnosis not present

## 2022-12-07 DIAGNOSIS — R278 Other lack of coordination: Secondary | ICD-10-CM | POA: Diagnosis not present

## 2022-12-10 ENCOUNTER — Encounter: Payer: Self-pay | Admitting: Family Medicine

## 2022-12-13 DIAGNOSIS — M6281 Muscle weakness (generalized): Secondary | ICD-10-CM | POA: Diagnosis not present

## 2022-12-13 DIAGNOSIS — R278 Other lack of coordination: Secondary | ICD-10-CM | POA: Diagnosis not present

## 2022-12-15 DIAGNOSIS — N1832 Chronic kidney disease, stage 3b: Secondary | ICD-10-CM | POA: Diagnosis not present

## 2022-12-15 DIAGNOSIS — R7303 Prediabetes: Secondary | ICD-10-CM | POA: Diagnosis not present

## 2022-12-25 DIAGNOSIS — I129 Hypertensive chronic kidney disease with stage 1 through stage 4 chronic kidney disease, or unspecified chronic kidney disease: Secondary | ICD-10-CM | POA: Diagnosis not present

## 2022-12-25 DIAGNOSIS — N183 Chronic kidney disease, stage 3 unspecified: Secondary | ICD-10-CM | POA: Diagnosis not present

## 2022-12-28 DIAGNOSIS — R278 Other lack of coordination: Secondary | ICD-10-CM | POA: Diagnosis not present

## 2022-12-28 DIAGNOSIS — M6281 Muscle weakness (generalized): Secondary | ICD-10-CM | POA: Diagnosis not present

## 2022-12-29 DIAGNOSIS — R801 Persistent proteinuria, unspecified: Secondary | ICD-10-CM | POA: Diagnosis not present

## 2022-12-29 DIAGNOSIS — E559 Vitamin D deficiency, unspecified: Secondary | ICD-10-CM | POA: Diagnosis not present

## 2022-12-29 DIAGNOSIS — M898X9 Other specified disorders of bone, unspecified site: Secondary | ICD-10-CM | POA: Diagnosis not present

## 2022-12-29 DIAGNOSIS — N183 Chronic kidney disease, stage 3 unspecified: Secondary | ICD-10-CM | POA: Diagnosis not present

## 2022-12-29 DIAGNOSIS — I129 Hypertensive chronic kidney disease with stage 1 through stage 4 chronic kidney disease, or unspecified chronic kidney disease: Secondary | ICD-10-CM | POA: Diagnosis not present

## 2022-12-29 DIAGNOSIS — N1832 Chronic kidney disease, stage 3b: Secondary | ICD-10-CM | POA: Diagnosis not present

## 2022-12-31 ENCOUNTER — Other Ambulatory Visit: Payer: Self-pay | Admitting: Family Medicine

## 2022-12-31 DIAGNOSIS — M6281 Muscle weakness (generalized): Secondary | ICD-10-CM | POA: Diagnosis not present

## 2022-12-31 DIAGNOSIS — I10 Essential (primary) hypertension: Secondary | ICD-10-CM

## 2022-12-31 DIAGNOSIS — R278 Other lack of coordination: Secondary | ICD-10-CM | POA: Diagnosis not present

## 2023-01-04 DIAGNOSIS — M6281 Muscle weakness (generalized): Secondary | ICD-10-CM | POA: Diagnosis not present

## 2023-01-04 DIAGNOSIS — R278 Other lack of coordination: Secondary | ICD-10-CM | POA: Diagnosis not present

## 2023-01-07 DIAGNOSIS — M6281 Muscle weakness (generalized): Secondary | ICD-10-CM | POA: Diagnosis not present

## 2023-01-07 DIAGNOSIS — R278 Other lack of coordination: Secondary | ICD-10-CM | POA: Diagnosis not present

## 2023-01-11 DIAGNOSIS — M6281 Muscle weakness (generalized): Secondary | ICD-10-CM | POA: Diagnosis not present

## 2023-01-11 DIAGNOSIS — R278 Other lack of coordination: Secondary | ICD-10-CM | POA: Diagnosis not present

## 2023-01-14 ENCOUNTER — Ambulatory Visit: Payer: Medicare Other | Admitting: Family Medicine

## 2023-01-18 DIAGNOSIS — M6281 Muscle weakness (generalized): Secondary | ICD-10-CM | POA: Diagnosis not present

## 2023-01-18 DIAGNOSIS — R278 Other lack of coordination: Secondary | ICD-10-CM | POA: Diagnosis not present

## 2023-01-19 ENCOUNTER — Ambulatory Visit (INDEPENDENT_AMBULATORY_CARE_PROVIDER_SITE_OTHER): Payer: Medicare Other | Admitting: Family Medicine

## 2023-01-19 ENCOUNTER — Encounter: Payer: Self-pay | Admitting: Family Medicine

## 2023-01-19 VITALS — BP 114/76 | HR 72 | Temp 97.4°F | Resp 18 | Ht 72.0 in | Wt 182.6 lb

## 2023-01-19 DIAGNOSIS — M1A09X0 Idiopathic chronic gout, multiple sites, without tophus (tophi): Secondary | ICD-10-CM | POA: Diagnosis not present

## 2023-01-19 DIAGNOSIS — I10 Essential (primary) hypertension: Secondary | ICD-10-CM

## 2023-01-19 DIAGNOSIS — I700 Atherosclerosis of aorta: Secondary | ICD-10-CM | POA: Diagnosis not present

## 2023-01-19 NOTE — Progress Notes (Signed)
Chief Complaint  Patient presents with   Hypertension   Follow-up    Subjective Steven Pearson is a 89 y.o. male who presents for hypertension follow up. He does monitor home blood pressures. Blood pressures ranging from 110's/70's on average. He is compliant with medications-Cont Micardis 40 mg/d, HCTZ 12.5 mg/d.2. Patient has these side effects of medication: none He is usually adhering to a healthy diet overall. Current exercise: active with physical therapy No CP or SOB.  Hyperlipidemia Patient presents for hyperlipidemia follow up. Currently being treated with Lipitor 20 mg/d and compliance with treatment thus far has been good. He denies myalgias. Diet/exercise as above.  The patient is not known to have coexisting coronary artery disease.  Gout Hx of gout at multiple sites. Compliant w allopurinol 100 mg/d. No AE's. No recent flares in 4 years.    Past Medical History:  Diagnosis Date   BPH (benign prostatic hypertrophy)    GERD (gastroesophageal reflux disease)    HOH (hard of hearing)    both ears   Hyperlipidemia    Hypertension    Normal cardiac stress test 11/28/13   Treadmill   PVC (premature ventricular contraction)     Exam BP 114/76 (BP Location: Left Arm, Cuff Size: Normal)   Pulse 72   Temp (!) 97.4 F (36.3 C) (Oral)   Resp 18   Ht 6' (1.829 m)   Wt 182 lb 9.6 oz (82.8 kg)   SpO2 98%   BMI 24.77 kg/m  General:  well developed, well nourished, in no apparent distress Heart: RRR, no bruits, no LE edema Lungs: clear to auscultation, no accessory muscle use Psych: well oriented with normal range of affect and appropriate judgment/insight  Essential hypertension, benign  Aortic atherosclerosis (HCC)  Chronic gout of multiple sites, unspecified cause  Chronic, stable. Cont Micardis 40 mg/d, HCTZ 12.5 mg/d. Counseled on diet and exercise. Chronic, stable. Cont Lipitor 40 mg/d.  Chronic, stable. Cont allopurinol 100 mg/d.  Recommended getting  tetanus booster at pharmacy.  F/u in 6 mo. The patient voiced understanding and agreement to the plan.  [place], DO 01/19/23  11:59 AM

## 2023-01-19 NOTE — Patient Instructions (Addendum)
No need to significantly change your diet at this time. Moderation is the key.  Do your best to stay active.   Consider getting your tetanus booster at the pharmacy.   Let us know if you need anything.

## 2023-01-21 DIAGNOSIS — R278 Other lack of coordination: Secondary | ICD-10-CM | POA: Diagnosis not present

## 2023-01-21 DIAGNOSIS — M6281 Muscle weakness (generalized): Secondary | ICD-10-CM | POA: Diagnosis not present

## 2023-01-25 DIAGNOSIS — R278 Other lack of coordination: Secondary | ICD-10-CM | POA: Diagnosis not present

## 2023-01-25 DIAGNOSIS — M6281 Muscle weakness (generalized): Secondary | ICD-10-CM | POA: Diagnosis not present

## 2023-01-28 DIAGNOSIS — R278 Other lack of coordination: Secondary | ICD-10-CM | POA: Diagnosis not present

## 2023-01-28 DIAGNOSIS — M6281 Muscle weakness (generalized): Secondary | ICD-10-CM | POA: Diagnosis not present

## 2023-02-01 DIAGNOSIS — R278 Other lack of coordination: Secondary | ICD-10-CM | POA: Diagnosis not present

## 2023-02-01 DIAGNOSIS — M6281 Muscle weakness (generalized): Secondary | ICD-10-CM | POA: Diagnosis not present

## 2023-02-04 DIAGNOSIS — R278 Other lack of coordination: Secondary | ICD-10-CM | POA: Diagnosis not present

## 2023-02-04 DIAGNOSIS — M6281 Muscle weakness (generalized): Secondary | ICD-10-CM | POA: Diagnosis not present

## 2023-02-08 DIAGNOSIS — R278 Other lack of coordination: Secondary | ICD-10-CM | POA: Diagnosis not present

## 2023-02-08 DIAGNOSIS — M6281 Muscle weakness (generalized): Secondary | ICD-10-CM | POA: Diagnosis not present

## 2023-02-11 DIAGNOSIS — R278 Other lack of coordination: Secondary | ICD-10-CM | POA: Diagnosis not present

## 2023-02-11 DIAGNOSIS — M6281 Muscle weakness (generalized): Secondary | ICD-10-CM | POA: Diagnosis not present

## 2023-02-14 ENCOUNTER — Encounter: Payer: Self-pay | Admitting: Family Medicine

## 2023-02-15 ENCOUNTER — Other Ambulatory Visit: Payer: Self-pay | Admitting: Family Medicine

## 2023-02-15 DIAGNOSIS — R278 Other lack of coordination: Secondary | ICD-10-CM | POA: Diagnosis not present

## 2023-02-15 DIAGNOSIS — M6281 Muscle weakness (generalized): Secondary | ICD-10-CM | POA: Diagnosis not present

## 2023-02-22 DIAGNOSIS — M6281 Muscle weakness (generalized): Secondary | ICD-10-CM | POA: Diagnosis not present

## 2023-02-22 DIAGNOSIS — R278 Other lack of coordination: Secondary | ICD-10-CM | POA: Diagnosis not present

## 2023-02-25 DIAGNOSIS — M6281 Muscle weakness (generalized): Secondary | ICD-10-CM | POA: Diagnosis not present

## 2023-02-25 DIAGNOSIS — R278 Other lack of coordination: Secondary | ICD-10-CM | POA: Diagnosis not present

## 2023-03-01 DIAGNOSIS — R278 Other lack of coordination: Secondary | ICD-10-CM | POA: Diagnosis not present

## 2023-03-01 DIAGNOSIS — M6281 Muscle weakness (generalized): Secondary | ICD-10-CM | POA: Diagnosis not present

## 2023-03-03 NOTE — Progress Notes (Signed)
Referring-Nicholas Wendling, DO Reason for referral-hypertension  HPI: 89 year old male for evaluation of hypertension at request of Riki Sheer, DO.  I have seen patient previously but not since 2015. Patient previously seen for PVCs.  Exercise treadmill in December of 2014: Patient exercised for 6 minutes with no chest pain and no ST changes. Occasional PVCs noted. Echocardiogram in November 2014 showed normal LV function, grade 1 diastolic dysfunction, mild left atrial enlargement. Holter monitor in January of 2014 showed sinus rhythm with frequent PVCs, occasional couplet, occasional PAC and brief PAT. Mean heart rate 82.  Abdominal CT December 2019 showed coronary artery atherosclerosis and aortic atherosclerosis.  There was note of abdominal aortic ectasia at 3.2 cm.  I am now asked to see patient for hypertension.  Patient has dyspnea when he begins exercising which improves as he continues.  There is no orthopnea, PND, pedal edema, exertional chest pain or syncope.  Current Outpatient Medications  Medication Sig Dispense Refill   allopurinol (ZYLOPRIM) 100 MG tablet TAKE 1 TABLET DAILY 90 tablet 1   atorvastatin (LIPITOR) 40 MG tablet TAKE 1 TABLET DAILY 90 tablet 3   hydrochlorothiazide (HYDRODIURIL) 12.5 MG tablet TAKE 1 TABLET DAILY 90 tablet 1   NON FORMULARY Take 120 mLs by mouth daily. Kefir probiotic otc     omeprazole (PRILOSEC) 10 MG capsule TAKE 1 CAPSULE DAILY 90 capsule 1   empagliflozin (JARDIANCE) 10 MG TABS tablet Take 1 tablet (10 mg total) by mouth daily before breakfast. (Patient not taking: Reported on 03/10/2023) 90 tablet 2   No current facility-administered medications for this visit.    Allergies  Allergen Reactions   Nitrofuran Derivatives Other (See Comments)    "Makes me feel bad"     Past Medical History:  Diagnosis Date   BPH (benign prostatic hypertrophy)    GERD (gastroesophageal reflux disease)    HOH (hard of hearing)    both ears    Hyperlipidemia    Hypertension    Normal cardiac stress test 11/28/13   Treadmill   PVC (premature ventricular contraction)     Past Surgical History:  Procedure Laterality Date   basal cell skin areas removed     more than 1 removed   CATARACT EXTRACTION, BILATERAL     left eye poor vision now   CYSTOSCOPY N/A 03/06/2018   Procedure: CYSTOSCOPY;  Surgeon: Raynelle Bring, MD;  Location: [facility];  Service: Urology;  Laterality: N/A;   HAND SURGERY     right   squamous cell skin pre cancer areas removed     x 1   TONSILLECTOMY     TRANSURETHRAL RESECTION OF PROSTATE N/A 03/06/2018   Procedure: TRANSURETHRAL RESECTION OF THE PROSTATE (TURP);  Surgeon: Raynelle Bring, MD;  Location: [facility];  Service: Urology;  Laterality: N/A;   TRANSURETHRAL RESECTION OF PROSTATE  01/06/2021    Social History   Socioeconomic History   Marital status: Widowed    Spouse name: Not on file   Number of children: 2   Years of education: Not on file   Highest education level: Not on file  Occupational History   Not on file  Tobacco Use   Smoking status: Former    Packs/day: 1.50    Years: 45.00    Additional pack years: 0.00    Total pack years: 67.50    Types: Cigarettes    Quit date: 08/17/1999    Years since quitting: 23.5   Smokeless tobacco: Never  Vaping Use  Vaping Use: Never used  Substance and Sexual Activity   Alcohol use: Yes    Alcohol/week: 5.0 standard drinks of alcohol    Types: 5 Glasses of wine per week    Comment: glass of wine per day   Drug use: No   Sexual activity: Not Currently  Other Topics Concern   Not on file  Social History Narrative   Not on file   Social Determinants of Health   Financial Resource Strain: Low Risk  (10/18/2022)   Overall Financial Resource Strain (CARDIA)    Difficulty of Paying Living Expenses: Not hard at all  Food Insecurity: No Food Insecurity (10/18/2022)   Hunger Vital Sign    Worried About Running Out of Food in the Last Year:  Never true    Ran Out of Food in the Last Year: Never true  Transportation Needs: No Transportation Needs (10/18/2022)   PRAPARE - Hydrologist (Medical): No    Lack of Transportation (Non-Medical): No  Physical Activity: Inactive (10/18/2022)   Exercise Vital Sign    Days of Exercise per Week: 0 days    Minutes of Exercise per Session: 0 min  Stress: No Stress Concern Present (10/18/2022)   [place]    Feeling of Stress : Not at all  Social Connections: Socially Isolated (10/18/2022)   Social Connection and Isolation Panel [NHANES]    Frequency of Communication with Friends and Family: More than three times a week    Frequency of Social Gatherings with Friends and Family: More than three times a week    Attends Religious Services: Never    Marine scientist or Organizations: No    Attends Archivist Meetings: Never    Marital Status: Widowed  Intimate Partner Violence: Not At Risk (10/18/2022)   Humiliation, Afraid, Rape, and Kick questionnaire    Fear of Current or Ex-Partner: No    Emotionally Abused: No    Physically Abused: No    Sexually Abused: No    Family History  Problem Relation Age of Onset   Heart Problems Mother    Heart disease Father        Pacemaker    ROS: no fevers or chills, productive cough, hemoptysis, dysphasia, odynophagia, melena, hematochezia, dysuria, hematuria, rash, seizure activity, orthopnea, PND, pedal edema, claudication. Remaining systems are negative.  Physical Exam:   Blood pressure 136/78, pulse 78, height 6' (1.829 m), weight 186 lb 3.2 oz (84.5 kg).  General:  Well developed/well nourished in NAD Skin warm/dry Patient not depressed No peripheral clubbing Back-normal HEENT-normal/normal eyelids Neck supple/normal carotid upstroke bilaterally; no bruits; no JVD; no thyromegaly chest - CTA/ normal expansion CV - RRR/normal  S1 and S2; no murmurs, rubs or gallops;  PMI nondisplaced Abdomen -NT/ND, no HSM, no mass, + bowel sounds, no bruit 2+ femoral pulses, no bruits Ext-no edema, chords, 2+ DP Neuro-grossly nonfocal  ECG -normal sinus rhythm with occasional PVC, normal axis, no ST changes.  Personally reviewed  A/P  1 hypertension-pressure is reasonably well-controlled.  Will continue HCTZ and follow-up.  2 hyperlipidemia-continue statin.  Patient also request calcium score.  3 abdominal aortic ectasia-plan follow-up ultrasound.  4 coronary calcification-continue statin.  Add aspirin 81 mg daily.  5 PVCs-patient noted to have frequent PVCs.  He has dyspnea when he initiates exercise which improves as he continues.  Will arrange echocardiogram to assess LV function.  If LV function reduced would  need to consider addition of beta-blockade.  Kirk Ruths, MD

## 2023-03-04 DIAGNOSIS — R278 Other lack of coordination: Secondary | ICD-10-CM | POA: Diagnosis not present

## 2023-03-04 DIAGNOSIS — M6281 Muscle weakness (generalized): Secondary | ICD-10-CM | POA: Diagnosis not present

## 2023-03-08 DIAGNOSIS — M6281 Muscle weakness (generalized): Secondary | ICD-10-CM | POA: Diagnosis not present

## 2023-03-08 DIAGNOSIS — R278 Other lack of coordination: Secondary | ICD-10-CM | POA: Diagnosis not present

## 2023-03-10 ENCOUNTER — Ambulatory Visit: Payer: Medicare Other | Attending: Cardiology | Admitting: Cardiology

## 2023-03-10 ENCOUNTER — Encounter: Payer: Self-pay | Admitting: Cardiology

## 2023-03-10 VITALS — BP 136/78 | HR 78 | Ht 72.0 in | Wt 186.2 lb

## 2023-03-10 DIAGNOSIS — E7800 Pure hypercholesterolemia, unspecified: Secondary | ICD-10-CM

## 2023-03-10 DIAGNOSIS — I2584 Coronary atherosclerosis due to calcified coronary lesion: Secondary | ICD-10-CM | POA: Insufficient documentation

## 2023-03-10 DIAGNOSIS — I7143 Infrarenal abdominal aortic aneurysm, without rupture: Secondary | ICD-10-CM

## 2023-03-10 DIAGNOSIS — R0609 Other forms of dyspnea: Secondary | ICD-10-CM

## 2023-03-10 DIAGNOSIS — I493 Ventricular premature depolarization: Secondary | ICD-10-CM

## 2023-03-10 DIAGNOSIS — I2510 Atherosclerotic heart disease of native coronary artery without angina pectoris: Secondary | ICD-10-CM

## 2023-03-10 IMAGING — CT CT HEAD W/O CM
3 of 4 series · 15 of 47 positions shown, 18 images · non-contrast
Comparison: None.

CLINICAL DATA: Temporary left-sided visual loss this morning.

EXAM:
CT HEAD WITHOUT CONTRAST
TECHNIQUE: Contiguous axial images were obtained from the base of the skull
through the vertex without intravenous contrast.

[Series 2: head wo · axial · 0.49mm/px · z∈[-170,-34]mm · 9 of 35 slices shown, 12 images]
[im 4/35  brain]
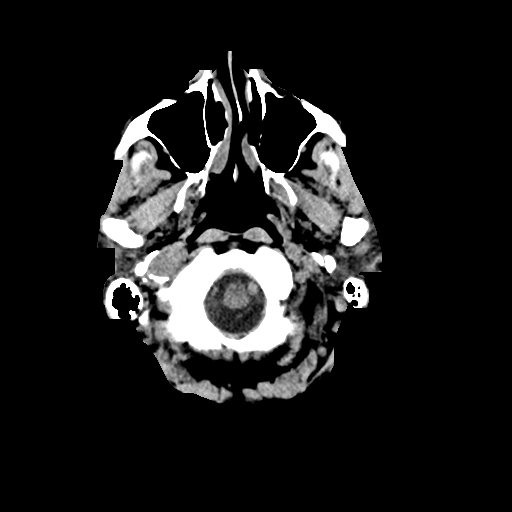
[im 4/35  bone]
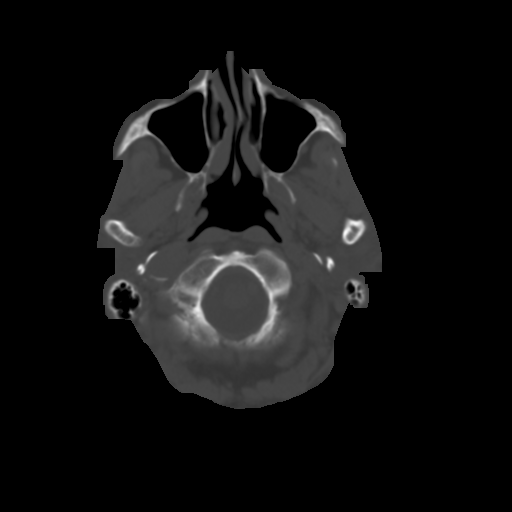
[im 7/35  brain]
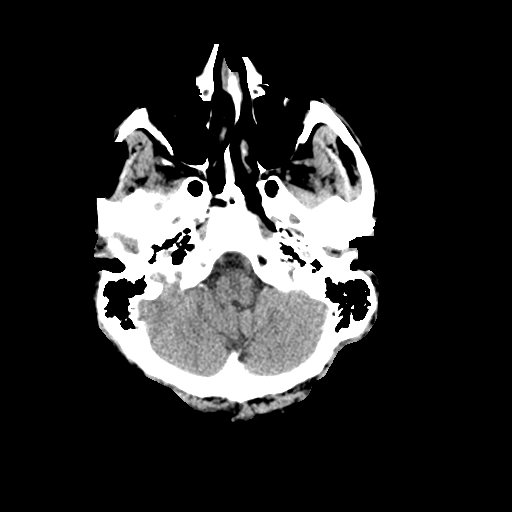
[im 11/35  brain]
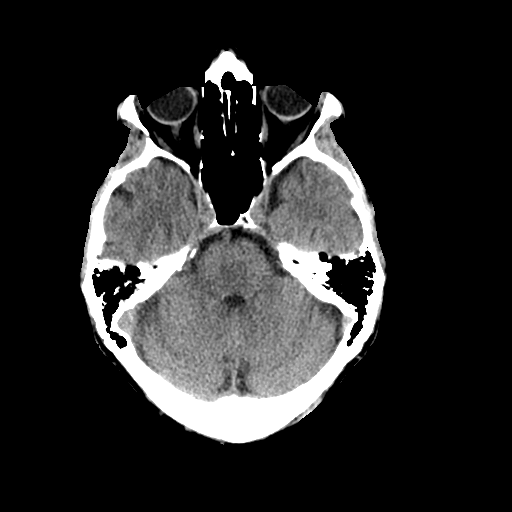
[im 14/35  brain]
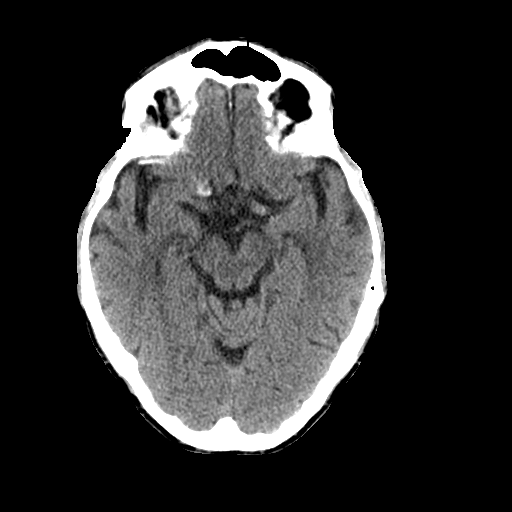
[im 18/35  brain]
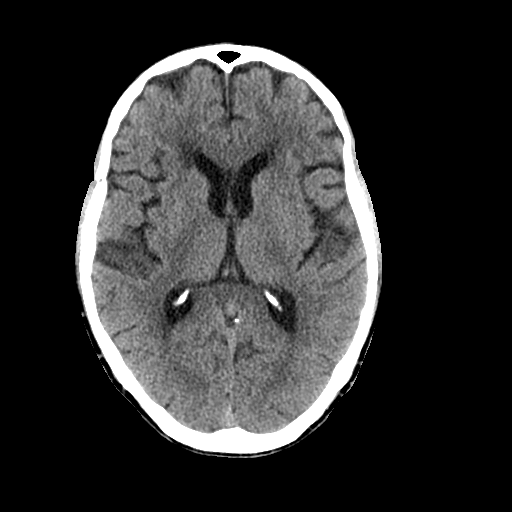
[im 18/35  bone]
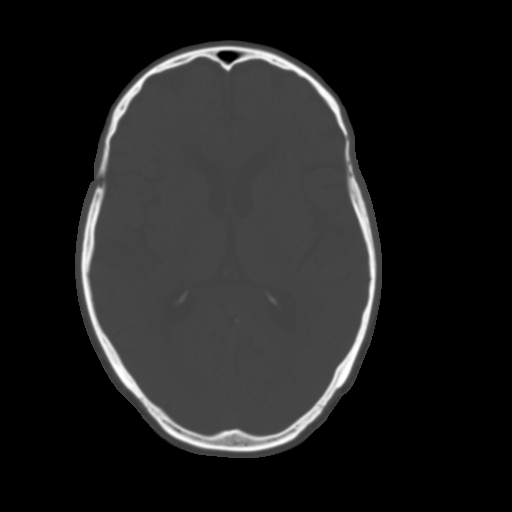
[im 21/35  brain]
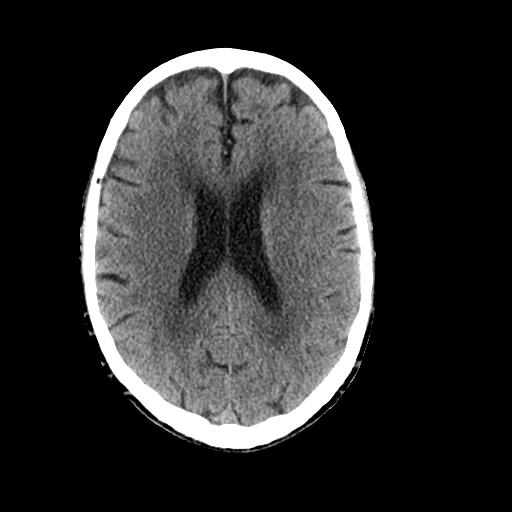
[im 24/35  brain]
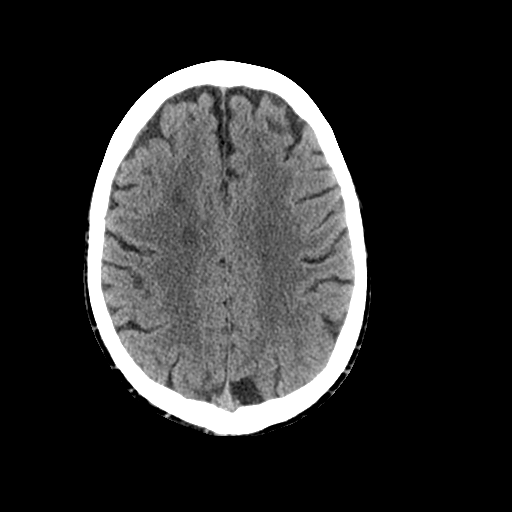
[im 28/35  brain]
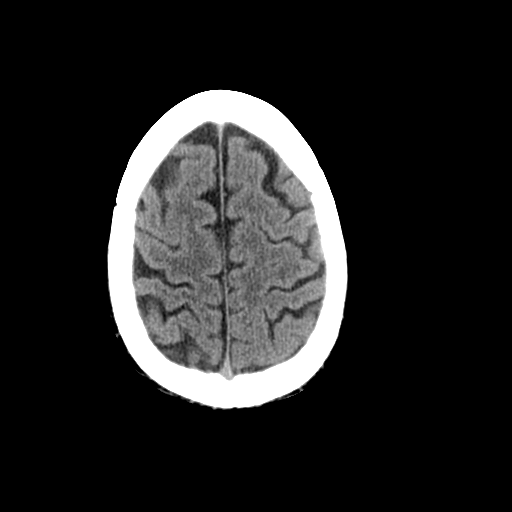
[im 31/35  brain]
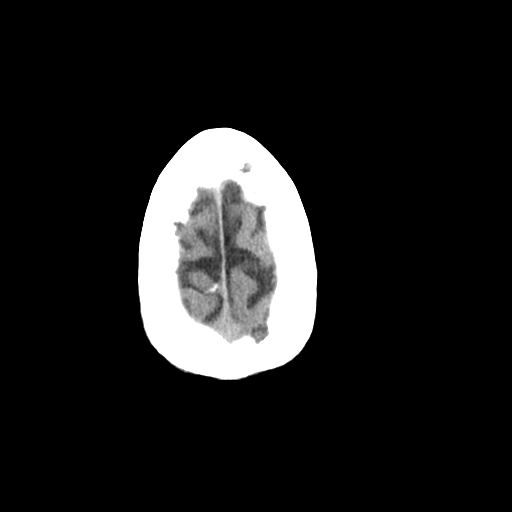
[im 31/35  bone]
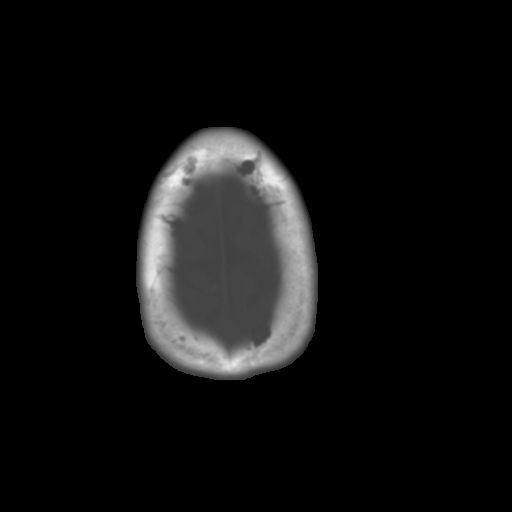

[Series 4: head wo coronal · coronal · 0.34mm/px · 3 of 69 slices shown]
[im 23/69  brain]
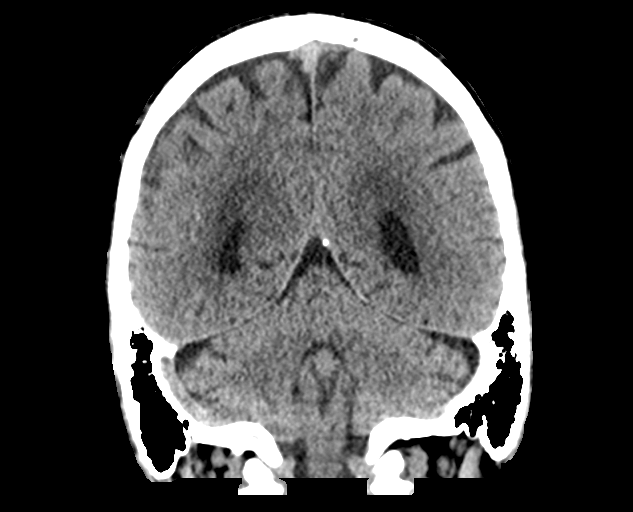
[im 31/69  brain]
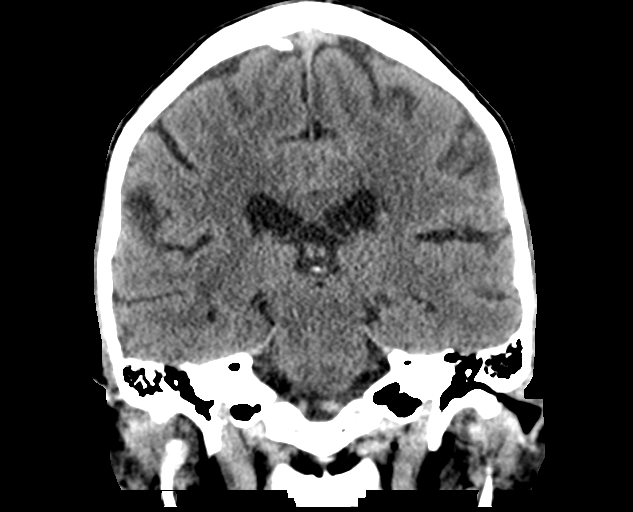
[im 38/69  brain]
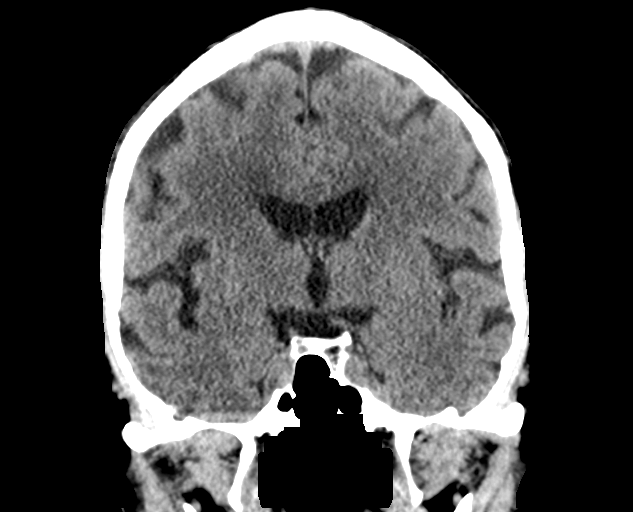

[Series 5: head wo sagittal · sagittal · 0.34mm/px · 3 of 72 slices shown]
[im 24/72  brain]
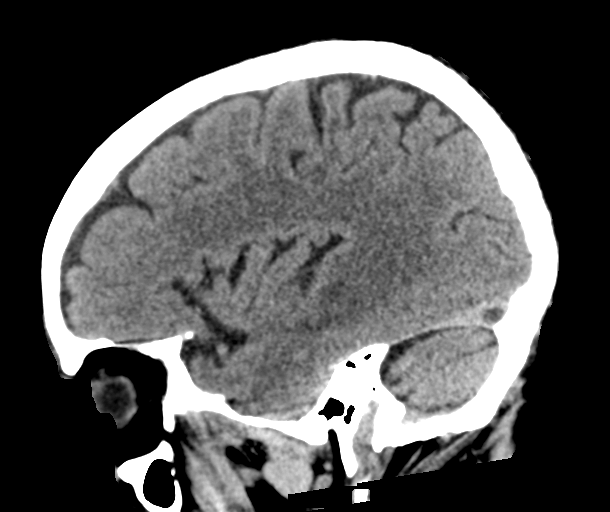
[im 36/72  brain]
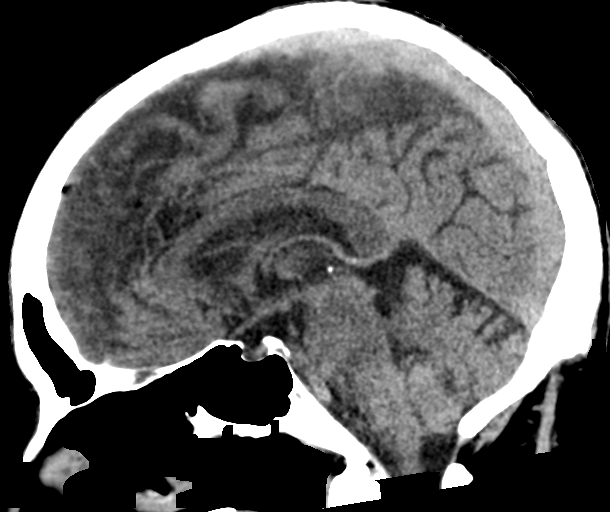
[im 48/72  brain]
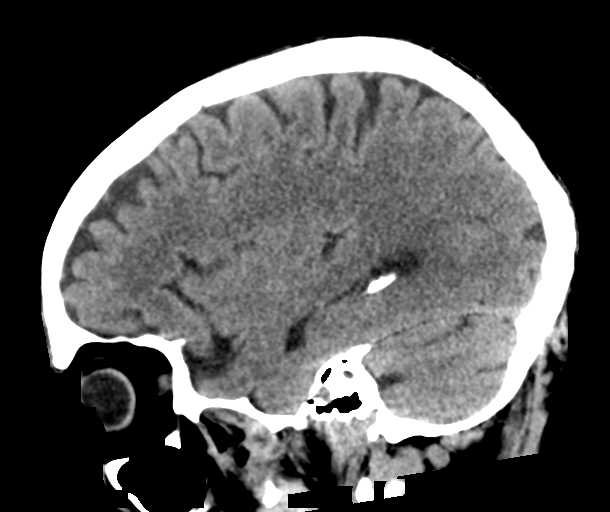

[15 of 47 positions shown; findings below may reference images not displayed]

FINDINGS: Brain: Ventricles, cisterns and other CSF spaces are normal. There
is mild chronic ischemic microvascular disease present. There is no
mass, mass effect, shift of midline structures or acute hemorrhage.
No evidence of acute infarction.

Vascular: No hyperdense vessel or unexpected calcification.

Skull: Normal. Negative for fracture or focal lesion.

Sinuses/Orbits: No acute finding.

Other: None.
IMPRESSION: 1. No acute findings.
2. Mild chronic ischemic microvascular disease.

## 2023-03-10 MED ORDER — ASPIRIN 81 MG PO TBEC: 81.0000 mg | DELAYED_RELEASE_TABLET | Freq: Every day | ORAL | 3 refills | Status: DC

## 2023-03-10 NOTE — Patient Instructions (Signed)
Medication Instructions:   START ASPIRIN 81 MG ONCE DAILY  *If you need a refill on your cardiac medications before your next appointment, please call your pharmacy*   Testing/Procedures:  Your physician has requested that you have an echocardiogram. Echocardiography is a painless test that uses sound waves to create images of your heart. It provides your doctor with information about the size and shape of your heart and how well your heart's chambers and valves are working. This procedure takes approximately one hour. There are no restrictions for this procedure. Please do NOT wear cologne, perfume, aftershave, or lotions (deodorant is allowed). Please arrive 15 minutes prior to your appointment time. [facility]-1ST FLOOR IMAGING DEPARTMENT  CORONARY CALCIUM SCORING CT SCAN AT THE [facility]  Your physician has requested that you have an abdominal aorta duplex. During this test, an ultrasound is used to evaluate the aorta. Allow 30 minutes for this exam. Do not eat after midnight the day before and avoid carbonated beverages [facility]   Follow-Up: At [facility], you and your health needs are our priority.  As part of our continuing mission to provide you with exceptional heart care, we have created designated Provider Care Teams.  These Care Teams include your primary Cardiologist (physician) and Advanced Practice Providers (APPs -  Physician Assistants and Nurse Practitioners) who all work together to provide you with the care you need, when you need it.  We recommend signing up for the patient portal called "MyChart".  Sign up information is provided on this After Visit Summary.  MyChart is used to connect with patients for Virtual Visits (Telemedicine).  Patients are able to view lab/test results, encounter notes, upcoming appointments, etc.  Non-urgent messages can be sent to your provider as well.   To learn more about what you can do with MyChart, go  to [email or web address].    Your next appointment:   6 month(s)  Provider:   Kirk Ruths, MD IN THE [facility] [facility]

## 2023-03-11 ENCOUNTER — Encounter: Payer: Self-pay | Admitting: Family Medicine

## 2023-03-11 DIAGNOSIS — M6281 Muscle weakness (generalized): Secondary | ICD-10-CM | POA: Diagnosis not present

## 2023-03-11 DIAGNOSIS — R278 Other lack of coordination: Secondary | ICD-10-CM | POA: Diagnosis not present

## 2023-03-14 ENCOUNTER — Other Ambulatory Visit: Payer: Self-pay | Admitting: Family Medicine

## 2023-03-14 ENCOUNTER — Ambulatory Visit (HOSPITAL_BASED_OUTPATIENT_CLINIC_OR_DEPARTMENT_OTHER)
Admission: RE | Admit: 2023-03-14 | Discharge: 2023-03-14 | Disposition: A | Discharge status: Home / Self Care | Payer: Medicare Other | Source: Ambulatory Visit | Attending: Cardiology | Admitting: Cardiology

## 2023-03-14 ENCOUNTER — Other Ambulatory Visit: Payer: Self-pay | Admitting: Cardiology

## 2023-03-14 DIAGNOSIS — I714 Abdominal aortic aneurysm, without rupture, unspecified: Secondary | ICD-10-CM | POA: Diagnosis not present

## 2023-03-14 DIAGNOSIS — R0609 Other forms of dyspnea: Secondary | ICD-10-CM

## 2023-03-14 DIAGNOSIS — I7143 Infrarenal abdominal aortic aneurysm, without rupture: Secondary | ICD-10-CM | POA: Diagnosis not present

## 2023-03-14 DIAGNOSIS — E7800 Pure hypercholesterolemia, unspecified: Secondary | ICD-10-CM

## 2023-03-14 DIAGNOSIS — I493 Ventricular premature depolarization: Secondary | ICD-10-CM

## 2023-03-14 DIAGNOSIS — I2510 Atherosclerotic heart disease of native coronary artery without angina pectoris: Secondary | ICD-10-CM

## 2023-03-14 DIAGNOSIS — Z136 Encounter for screening for cardiovascular disorders: Secondary | ICD-10-CM | POA: Diagnosis not present

## 2023-03-15 DIAGNOSIS — M6281 Muscle weakness (generalized): Secondary | ICD-10-CM | POA: Diagnosis not present

## 2023-03-15 DIAGNOSIS — R278 Other lack of coordination: Secondary | ICD-10-CM | POA: Diagnosis not present

## 2023-03-16 ENCOUNTER — Encounter: Payer: Self-pay | Admitting: Cardiology

## 2023-03-18 DIAGNOSIS — R278 Other lack of coordination: Secondary | ICD-10-CM | POA: Diagnosis not present

## 2023-03-18 DIAGNOSIS — M6281 Muscle weakness (generalized): Secondary | ICD-10-CM | POA: Diagnosis not present

## 2023-03-22 DIAGNOSIS — M6281 Muscle weakness (generalized): Secondary | ICD-10-CM | POA: Diagnosis not present

## 2023-03-22 DIAGNOSIS — R278 Other lack of coordination: Secondary | ICD-10-CM | POA: Diagnosis not present

## 2023-03-25 DIAGNOSIS — M6281 Muscle weakness (generalized): Secondary | ICD-10-CM | POA: Diagnosis not present

## 2023-03-25 DIAGNOSIS — R278 Other lack of coordination: Secondary | ICD-10-CM | POA: Diagnosis not present

## 2023-03-29 DIAGNOSIS — R278 Other lack of coordination: Secondary | ICD-10-CM | POA: Diagnosis not present

## 2023-03-29 DIAGNOSIS — M6281 Muscle weakness (generalized): Secondary | ICD-10-CM | POA: Diagnosis not present

## 2023-03-30 ENCOUNTER — Ambulatory Visit (HOSPITAL_BASED_OUTPATIENT_CLINIC_OR_DEPARTMENT_OTHER)
Admission: RE | Admit: 2023-03-30 | Discharge: 2023-03-30 | Disposition: A | Discharge status: Home / Self Care | Payer: Medicare Other | Source: Ambulatory Visit | Attending: Cardiology | Admitting: Cardiology

## 2023-03-30 ENCOUNTER — Encounter (HOSPITAL_BASED_OUTPATIENT_CLINIC_OR_DEPARTMENT_OTHER): Payer: Self-pay

## 2023-03-30 ENCOUNTER — Ambulatory Visit (HOSPITAL_BASED_OUTPATIENT_CLINIC_OR_DEPARTMENT_OTHER): Payer: Medicare Other

## 2023-03-30 DIAGNOSIS — R0609 Other forms of dyspnea: Secondary | ICD-10-CM | POA: Insufficient documentation

## 2023-03-31 LAB — ECHOCARDIOGRAM COMPLETE
Area-P 1/2: 3.48 cm2
S' Lateral: 3 cm

## 2023-04-01 ENCOUNTER — Encounter: Payer: Self-pay | Admitting: Cardiology

## 2023-04-06 DIAGNOSIS — R278 Other lack of coordination: Secondary | ICD-10-CM | POA: Diagnosis not present

## 2023-04-06 DIAGNOSIS — M6281 Muscle weakness (generalized): Secondary | ICD-10-CM | POA: Diagnosis not present

## 2023-04-07 ENCOUNTER — Other Ambulatory Visit: Payer: Self-pay | Admitting: *Deleted

## 2023-04-07 DIAGNOSIS — R0609 Other forms of dyspnea: Secondary | ICD-10-CM

## 2023-04-13 DIAGNOSIS — R278 Other lack of coordination: Secondary | ICD-10-CM | POA: Diagnosis not present

## 2023-04-13 DIAGNOSIS — M6281 Muscle weakness (generalized): Secondary | ICD-10-CM | POA: Diagnosis not present

## 2023-04-20 DIAGNOSIS — R278 Other lack of coordination: Secondary | ICD-10-CM | POA: Diagnosis not present

## 2023-04-20 DIAGNOSIS — M6281 Muscle weakness (generalized): Secondary | ICD-10-CM | POA: Diagnosis not present

## 2023-06-20 ENCOUNTER — Encounter: Payer: Self-pay | Admitting: Cardiology

## 2023-06-20 ENCOUNTER — Encounter: Payer: Self-pay | Admitting: Family Medicine

## 2023-06-22 ENCOUNTER — Encounter: Payer: Self-pay | Admitting: Family Medicine

## 2023-06-22 ENCOUNTER — Other Ambulatory Visit: Payer: Self-pay | Admitting: Family Medicine

## 2023-06-22 DIAGNOSIS — I10 Essential (primary) hypertension: Secondary | ICD-10-CM

## 2023-06-22 DIAGNOSIS — I700 Atherosclerosis of aorta: Secondary | ICD-10-CM

## 2023-06-22 MED ORDER — ATORVASTATIN CALCIUM 40 MG PO TABS: 40.0000 mg | ORAL_TABLET | Freq: Every day | ORAL | 3 refills | Status: DC

## 2023-06-29 ENCOUNTER — Ambulatory Visit (HOSPITAL_COMMUNITY): Payer: Medicare Other

## 2023-07-11 ENCOUNTER — Telehealth: Payer: Self-pay

## 2023-07-11 NOTE — Telephone Encounter (Signed)
PA initiated via Covermymeds; KEY: [id number].  [facility] has indicated that it is too soon to refill this medication at the pharmacy for your patient. If you need to renew an existing PA for your patient's medication, please reach out to [facility] directly at [phone]

## 2023-07-12 ENCOUNTER — Ambulatory Visit: Payer: Medicare Other | Admitting: Family Medicine

## 2023-07-20 ENCOUNTER — Ambulatory Visit: Payer: Medicare Other | Admitting: Family Medicine

## 2023-07-25 ENCOUNTER — Encounter: Payer: Self-pay | Admitting: Family Medicine

## 2023-07-26 ENCOUNTER — Other Ambulatory Visit: Payer: Self-pay | Admitting: *Deleted

## 2023-07-26 DIAGNOSIS — R7303 Prediabetes: Secondary | ICD-10-CM

## 2023-07-26 DIAGNOSIS — N1832 Chronic kidney disease, stage 3b: Secondary | ICD-10-CM

## 2023-07-26 DIAGNOSIS — M1A00X0 Idiopathic chronic gout, unspecified site, without tophus (tophi): Secondary | ICD-10-CM

## 2023-07-26 DIAGNOSIS — E785 Hyperlipidemia, unspecified: Secondary | ICD-10-CM

## 2023-07-28 ENCOUNTER — Telehealth: Payer: Self-pay

## 2023-07-28 ENCOUNTER — Other Ambulatory Visit (INDEPENDENT_AMBULATORY_CARE_PROVIDER_SITE_OTHER): Payer: Medicare Other

## 2023-07-28 DIAGNOSIS — R7303 Prediabetes: Secondary | ICD-10-CM

## 2023-07-28 DIAGNOSIS — N1832 Chronic kidney disease, stage 3b: Secondary | ICD-10-CM

## 2023-07-28 DIAGNOSIS — M1A00X0 Idiopathic chronic gout, unspecified site, without tophus (tophi): Secondary | ICD-10-CM

## 2023-07-28 DIAGNOSIS — E785 Hyperlipidemia, unspecified: Secondary | ICD-10-CM | POA: Diagnosis not present

## 2023-07-28 LAB — COMPREHENSIVE METABOLIC PANEL
ALT: 11 U/L (ref 0–53)
AST: 16 U/L (ref 0–37)
Albumin: 4.2 g/dL (ref 3.5–5.2)
Alkaline Phosphatase: 63 U/L (ref 39–117)
BUN: 31 mg/dL — ABNORMAL HIGH (ref 6–23)
CO2: 28 mEq/L (ref 19–32)
Calcium: 9.4 mg/dL (ref 8.4–10.5)
Chloride: 100 mEq/L (ref 96–112)
Creatinine, Ser: 1.94 mg/dL — ABNORMAL HIGH (ref 0.40–1.50)
GFR: 30.28 mL/min — ABNORMAL LOW (ref >60.00)
Glucose, Bld: 98 mg/dL (ref 70–99)
Potassium: 4.1 mEq/L (ref 3.5–5.1)
Sodium: 137 mEq/L (ref 135–145)
Total Bilirubin: 1.1 mg/dL (ref 0.2–1.2)
Total Protein: 6.8 g/dL (ref 6.0–8.3)

## 2023-07-28 LAB — CBC
HCT: 43.4 % (ref 39.0–52.0)
Hemoglobin: 14 g/dL (ref 13.0–17.0)
MCHC: 32.2 g/dL (ref 30.0–36.0)
MCV: 98.8 fl (ref 78.0–100.0)
Platelets: 174 10*3/uL (ref 150.0–400.0)
RBC: 4.39 Mil/uL (ref 4.22–5.81)
RDW: 14.1 % (ref 11.5–15.5)
WBC: 5 10*3/uL (ref 4.0–10.5)

## 2023-07-28 LAB — LIPID PANEL
Cholesterol: 143 mg/dL (ref 0–200)
HDL: 53.6 mg/dL (ref >39.00)
LDL Cholesterol: 71 mg/dL (ref 0–99)
NonHDL: 89.19
Total CHOL/HDL Ratio: 3
Triglycerides: 92 mg/dL (ref 0.0–149.0)
VLDL: 18.4 mg/dL (ref 0.0–40.0)

## 2023-07-28 LAB — VITAMIN D 25 HYDROXY (VIT D DEFICIENCY, FRACTURES): VITD: 29.55 ng/mL — ABNORMAL LOW (ref 30.00–100.00)

## 2023-07-28 LAB — URIC ACID: Uric Acid, Serum: 5.9 mg/dL (ref 4.0–7.8)

## 2023-07-28 LAB — HEMOGLOBIN A1C: Hgb A1c MFr Bld: 5.8 % (ref 4.6–6.5)

## 2023-07-28 LAB — PHOSPHORUS: Phosphorus: 3.1 mg/dL (ref 2.3–4.6)

## 2023-07-28 NOTE — Telephone Encounter (Signed)
PA initiated via Covermymeds; KEY: [id number]. PA approved.  Effective 06/28/2023 through 07/27/2024

## 2023-08-02 ENCOUNTER — Encounter: Payer: Self-pay | Admitting: Family Medicine

## 2023-08-02 ENCOUNTER — Ambulatory Visit (INDEPENDENT_AMBULATORY_CARE_PROVIDER_SITE_OTHER): Payer: Medicare Other | Admitting: Family Medicine

## 2023-08-02 VITALS — BP 114/85 | HR 85 | Temp 97.6°F | Ht 72.0 in | Wt 182.5 lb

## 2023-08-02 DIAGNOSIS — M1A00X0 Idiopathic chronic gout, unspecified site, without tophus (tophi): Secondary | ICD-10-CM

## 2023-08-02 DIAGNOSIS — I10 Essential (primary) hypertension: Secondary | ICD-10-CM | POA: Diagnosis not present

## 2023-08-02 DIAGNOSIS — I700 Atherosclerosis of aorta: Secondary | ICD-10-CM

## 2023-08-02 DIAGNOSIS — N1832 Chronic kidney disease, stage 3b: Secondary | ICD-10-CM | POA: Diagnosis not present

## 2023-08-02 DIAGNOSIS — E785 Hyperlipidemia, unspecified: Secondary | ICD-10-CM | POA: Diagnosis not present

## 2023-08-02 NOTE — Patient Instructions (Signed)
Keep the diet clean and stay active.  Let us know if you need anything. 

## 2023-08-02 NOTE — Progress Notes (Signed)
Chief Complaint  Patient presents with   Follow-up    6 month    Subjective Steven Pearson is a 89 y.o. male who presents for hypertension follow up. He does monitor home blood pressures. Blood pressures ranging from 110's/70's on average. He is compliant with medications- hydrochlorothiazide 12.5 mg/d. Patient has these side effects of medication: none He is adhering to a healthy diet overall. Current exercise: limited No CP. Some SOB.    Hyperlipidemia Patient presents for dyslipidemia follow up. Currently being treated with Lipitor 40 mg/d and compliance with treatment thus far has been good. He denies myalgias. Diet/exercise as above.  The patient is not known to have coexisting coronary artery disease.  Gout Currently taking allopurinol 100 mg daily.  Reports compliance no adverse effects.  Most recent uric acid was 5.9.  He has not had a flare since his last visit.  Past Medical History:  Diagnosis Date   BPH (benign prostatic hypertrophy)    GERD (gastroesophageal reflux disease)    HOH (hard of hearing)    both ears   Hyperlipidemia    Hypertension    Normal cardiac stress test 11/28/13   Treadmill   PVC (premature ventricular contraction)     Exam BP 114/85 (BP Location: Left Arm, Patient Position: Sitting, Cuff Size: Normal)   Pulse 85   Temp 97.6 F (36.4 C) (Oral)   Ht 6' (1.829 m)   Wt 182 lb 8 oz (82.8 kg)   SpO2 98%   BMI 24.75 kg/m  General:  well developed, well nourished, in no apparent distress Heart: RRR, no bruits, no LE edema Lungs: clear to auscultation, no accessory muscle use Psych: well oriented with normal range of affect and appropriate judgment/insight  Essential hypertension, benign  Hyperlipidemia, unspecified hyperlipidemia type  Idiopathic chronic gout without tophus, unspecified site  Stage 3b chronic kidney disease (HCC)  Aortic atherosclerosis (HCC)  Chronic, stable.  Continue hydrochlorothiazide 12.5 mg daily.   Counseled on diet and exercise. Chronic, stable.  Continue Lipitor 40 mg daily. Chronic, stable.  Continue allopurinol 100 mg daily. Chronic, hopefully stable.  He follows with nephrology.  He is on Jardiance 10 mg daily. F/u in 6 mo. The patient voiced understanding and agreement to the plan.  Jilda Roche Ramos, DO 08/02/23  11:44 AM

## 2023-08-09 DIAGNOSIS — R801 Persistent proteinuria, unspecified: Secondary | ICD-10-CM | POA: Diagnosis not present

## 2023-08-09 DIAGNOSIS — I129 Hypertensive chronic kidney disease with stage 1 through stage 4 chronic kidney disease, or unspecified chronic kidney disease: Secondary | ICD-10-CM | POA: Diagnosis not present

## 2023-08-09 DIAGNOSIS — E559 Vitamin D deficiency, unspecified: Secondary | ICD-10-CM | POA: Diagnosis not present

## 2023-08-09 DIAGNOSIS — M898X9 Other specified disorders of bone, unspecified site: Secondary | ICD-10-CM | POA: Diagnosis not present

## 2023-08-09 DIAGNOSIS — N183 Chronic kidney disease, stage 3 unspecified: Secondary | ICD-10-CM | POA: Diagnosis not present

## 2023-08-22 NOTE — Progress Notes (Signed)
HPI: FU hypertension.  Patient previously seen for PVCs.  Exercise treadmill in December of 2014: Patient exercised for 6 minutes with no chest pain and no ST changes. Occasional PVCs noted. Echocardiogram in November 2014 showed normal LV function, grade 1 diastolic dysfunction, mild left atrial enlargement. Holter monitor in January of 2014 showed sinus rhythm with frequent PVCs, occasional couplet, occasional PAC and brief PAT. Mean heart rate 82. Abdominal ultrasound March 2024 showed abdominal aortic aneurysm measuring 3.6 cm.  There was also aneurysmal dilatation of the right common iliac artery at 2 cm.  Echocardiogram April 2024 showed ejection fraction 30 to 35%, grade 1 diastolic dysfunction.  I personally reviewed this and felt the study was technically difficult but LV function likely preserved.  Cardiac MRI ordered.  Since last seen, patient denies dyspnea, chest pain, palpitations or syncope.  Current Outpatient Medications  Medication Sig Dispense Refill   allopurinol (ZYLOPRIM) 100 MG tablet TAKE 1 TABLET DAILY 90 tablet 1   atorvastatin (LIPITOR) 40 MG tablet Take 1 tablet (40 mg total) by mouth daily. 90 tablet 3   hydrochlorothiazide (HYDRODIURIL) 12.5 MG tablet TAKE 1 TABLET DAILY 90 tablet 1   JARDIANCE 10 MG TABS tablet TAKE 1 TABLET DAILY BEFORE BREAKFAST 90 tablet 2   metoprolol succinate (TOPROL XL) 25 MG 24 hr tablet Take 1 tablet (25 mg total) by mouth at bedtime. 90 tablet 3   NON FORMULARY Take 120 mLs by mouth daily. Kefir probiotic otc     omeprazole (PRILOSEC) 10 MG capsule TAKE 1 CAPSULE DAILY 90 capsule 1   aspirin EC 81 MG tablet Take 1 tablet (81 mg total) by mouth daily. Swallow whole. (Patient not taking: Reported on 08/31/2023) 90 tablet 3   No current facility-administered medications for this visit.     Past Medical History:  Diagnosis Date   BPH (benign prostatic hypertrophy)    GERD (gastroesophageal reflux disease)    HOH (hard of hearing)     both ears   Hyperlipidemia    Hypertension    Normal cardiac stress test 11/28/13   Treadmill   PVC (premature ventricular contraction)     Past Surgical History:  Procedure Laterality Date   basal cell skin areas removed     more than 1 removed   CATARACT EXTRACTION, BILATERAL     left eye poor vision now   CYSTOSCOPY N/A 03/06/2018   Procedure: CYSTOSCOPY;  Surgeon: Heloise Purpura, MD;  Location: [facility];  Service: Urology;  Laterality: N/A;   HAND SURGERY     right   squamous cell skin pre cancer areas removed     x 1   TONSILLECTOMY     TRANSURETHRAL RESECTION OF PROSTATE N/A 03/06/2018   Procedure: TRANSURETHRAL RESECTION OF THE PROSTATE (TURP);  Surgeon: Heloise Purpura, MD;  Location: [facility];  Service: Urology;  Laterality: N/A;   TRANSURETHRAL RESECTION OF PROSTATE  01/06/2021    Social History   Socioeconomic History   Marital status: Widowed    Spouse name: Not on file   Number of children: 2   Years of education: Not on file   Highest education level: Not on file  Occupational History   Not on file  Tobacco Use   Smoking status: Former    Current packs/day: 0.00    Average packs/day: 1.5 packs/day for 45.0 years (67.5 ttl pk-yrs)    Types: Cigarettes    Start date: 08/16/1954    Quit date: 08/17/1999  Years since quitting: 24.0   Smokeless tobacco: Never  Vaping Use   Vaping status: Never Used  Substance and Sexual Activity   Alcohol use: Yes    Alcohol/week: 5.0 standard drinks of alcohol    Types: 5 Glasses of wine per week    Comment: glass of wine per day   Drug use: No   Sexual activity: Not Currently  Other Topics Concern   Not on file  Social History Narrative   Not on file   Social Determinants of Health   Financial Resource Strain: Low Risk  (10/18/2022)   Overall Financial Resource Strain (CARDIA)    Difficulty of Paying Living Expenses: Not hard at all  Food Insecurity: No Food Insecurity (10/18/2022)   Hunger Vital Sign    Worried  About Running Out of Food in the Last Year: Never true    Ran Out of Food in the Last Year: Never true  Transportation Needs: No Transportation Needs (10/18/2022)   PRAPARE - Administrator, Civil Service (Medical): No    Lack of Transportation (Non-Medical): No  Physical Activity: Inactive (10/18/2022)   Exercise Vital Sign    Days of Exercise per Week: 0 days    Minutes of Exercise per Session: 0 min  Stress: No Stress Concern Present (10/18/2022)   [employer] of Occupational Health - Occupational Stress Questionnaire    Feeling of Stress : Not at all  Social Connections: Socially Isolated (10/18/2022)   Social Connection and Isolation Panel [NHANES]    Frequency of Communication with Friends and Family: More than three times a week    Frequency of Social Gatherings with Friends and Family: More than three times a week    Attends Religious Services: Never    Database administrator or Organizations: No    Attends Banker Meetings: Never    Marital Status: Widowed  Intimate Partner Violence: Not At Risk (10/18/2022)   Humiliation, Afraid, Rape, and Kick questionnaire    Fear of Current or Ex-Partner: No    Emotionally Abused: No    Physically Abused: No    Sexually Abused: No    Family History  Problem Relation Age of Onset   Heart Problems Mother    Heart disease Father        Pacemaker    ROS: no fevers or chills, productive cough, hemoptysis, dysphasia, odynophagia, melena, hematochezia, dysuria, hematuria, rash, seizure activity, orthopnea, PND, pedal edema, claudication. Remaining systems are negative.  Physical Exam: Well-developed well-nourished in no acute distress.  Skin is warm and dry.  HEENT is normal.  Neck is supple.  Chest is clear to auscultation with normal expansion.  Cardiovascular exam is regular rate and rhythm.  Abdominal exam nontender or distended. No masses palpated. Extremities show no edema. neuro grossly  intact   A/P  1 hypertension-patient's blood pressure is controlled.  Continue myocarditis and HCTZ.  2 hyperlipidemia-continue statin.  Note calcium score ordered at last office visit but not performed.  3 abdominal aortic aneurysm-plan follow-up ultrasound March 2025.  4 coronary calcification-continue aspirin and statin.  5 history of PVCs-add Toprol 25 mg daily.  6 decreased LV function-previous echocardiogram interpreted as ejection fraction 30 to 35%.  I did review this and it was technically difficult but I thought LV function preserved.  We originally planned cardiac MRI.  However I wonder if PVCs may be contributing to any reduction in LV function.  Continue ARB.  I have added Toprol 25 mg  daily.  Reassess LV function with echocardiogram in 3 months.  Patient would like to be conservative if possible which is reasonable given his age.  Olga Millers, MD

## 2023-08-31 ENCOUNTER — Ambulatory Visit: Payer: Medicare Other | Attending: Cardiology | Admitting: Cardiology

## 2023-08-31 ENCOUNTER — Encounter: Payer: Self-pay | Admitting: Cardiology

## 2023-08-31 VITALS — BP 122/84 | HR 93 | Ht 72.0 in | Wt 183.4 lb

## 2023-08-31 DIAGNOSIS — I519 Heart disease, unspecified: Secondary | ICD-10-CM

## 2023-08-31 DIAGNOSIS — E7800 Pure hypercholesterolemia, unspecified: Secondary | ICD-10-CM

## 2023-08-31 DIAGNOSIS — I493 Ventricular premature depolarization: Secondary | ICD-10-CM | POA: Diagnosis not present

## 2023-08-31 DIAGNOSIS — I2510 Atherosclerotic heart disease of native coronary artery without angina pectoris: Secondary | ICD-10-CM | POA: Diagnosis not present

## 2023-08-31 DIAGNOSIS — I2584 Coronary atherosclerosis due to calcified coronary lesion: Secondary | ICD-10-CM | POA: Insufficient documentation

## 2023-08-31 MED ORDER — METOPROLOL SUCCINATE ER 25 MG PO TB24: 25.0000 mg | ORAL_TABLET | Freq: Every day | ORAL | 3 refills | Status: DC

## 2023-08-31 NOTE — Patient Instructions (Addendum)
Medication Instructions:   START Toprol one tablet by mouth ( 25 mg) daily at bedtime.   *If you need a refill on your cardiac medications before your next appointment, please call your pharmacy*   Lab Work:  None ordered.  If you have labs (blood work) drawn today and your tests are completely normal, you will receive your results only by: MyChart Message (if you have MyChart) OR A paper copy in the mail If you have any lab test that is abnormal or we need to change your treatment, we will call you to review the results.   Testing/Procedures: Will call to schedule.   Your physician has requested that you have an echocardiogram. Echocardiography is a painless test that uses sound waves to create images of your heart. It provides your doctor with information about the size and shape of your heart and how well your heart's chambers and valves are working. This procedure takes approximately one hour. There are no restrictions for this procedure. Please do NOT wear cologne, aftershave, or lotions (deodorant is allowed). Please arrive 15 minutes prior to your appointment time.    Follow-Up: At [facility], you and your health needs are our priority.  As part of our continuing mission to provide you with exceptional heart care, we have created designated Provider Care Teams.  These Care Teams include your primary Cardiologist (physician) and Advanced Practice Providers (APPs -  Physician Assistants and Nurse Practitioners) who all work together to provide you with the care you need, when you need it.  We recommend signing up for the patient portal called "MyChart".  Sign up information is provided on this After Visit Summary.  MyChart is used to connect with patients for Virtual Visits (Telemedicine).  Patients are able to view lab/test results, encounter notes, upcoming appointments, etc.  Non-urgent messages can be sent to your provider as well.   To learn more about what you can  do with MyChart, go to [email or web address].    Your next appointment:   5  month(s)  Provider:   Olga Millers, MD

## 2023-09-01 ENCOUNTER — Encounter: Payer: Self-pay | Admitting: Family Medicine

## 2023-09-03 ENCOUNTER — Other Ambulatory Visit: Payer: Self-pay | Admitting: Family Medicine

## 2023-09-08 ENCOUNTER — Ambulatory Visit (HOSPITAL_BASED_OUTPATIENT_CLINIC_OR_DEPARTMENT_OTHER)
Admission: RE | Admit: 2023-09-08 | Discharge: 2023-09-08 | Disposition: A | Discharge status: Home / Self Care | Payer: Medicare Other | Source: Ambulatory Visit | Attending: Cardiology | Admitting: Cardiology

## 2023-09-08 DIAGNOSIS — I2510 Atherosclerotic heart disease of native coronary artery without angina pectoris: Secondary | ICD-10-CM | POA: Insufficient documentation

## 2023-09-08 DIAGNOSIS — I2584 Coronary atherosclerosis due to calcified coronary lesion: Secondary | ICD-10-CM | POA: Insufficient documentation

## 2023-09-09 ENCOUNTER — Encounter: Payer: Self-pay | Admitting: Cardiology

## 2023-10-04 DIAGNOSIS — Z23 Encounter for immunization: Secondary | ICD-10-CM | POA: Diagnosis not present

## 2023-10-17 ENCOUNTER — Telehealth: Payer: Self-pay | Admitting: Family Medicine

## 2023-10-17 NOTE — Telephone Encounter (Signed)
Copied from CRM [id number]. Topic: Medicare AWV >> Oct 17, 2023 11:48 AM Payton Doughty wrote: Reason for CRM: Called LVM 10/18/2023 to schedule Annual Wellness Visit  Verlee Rossetti; Care Guide Ambulatory Clinical Support [place] [facility] Direct Dial: [phone]

## 2023-12-04 ENCOUNTER — Other Ambulatory Visit: Payer: Self-pay | Admitting: Family Medicine

## 2023-12-04 DIAGNOSIS — I700 Atherosclerosis of aorta: Secondary | ICD-10-CM

## 2023-12-12 ENCOUNTER — Other Ambulatory Visit: Payer: Self-pay | Admitting: Cardiology

## 2023-12-12 DIAGNOSIS — I2510 Atherosclerotic heart disease of native coronary artery without angina pectoris: Secondary | ICD-10-CM

## 2023-12-12 DIAGNOSIS — E7800 Pure hypercholesterolemia, unspecified: Secondary | ICD-10-CM

## 2023-12-12 DIAGNOSIS — I519 Heart disease, unspecified: Secondary | ICD-10-CM

## 2023-12-12 DIAGNOSIS — I493 Ventricular premature depolarization: Secondary | ICD-10-CM

## 2024-01-02 ENCOUNTER — Ambulatory Visit (HOSPITAL_BASED_OUTPATIENT_CLINIC_OR_DEPARTMENT_OTHER)
Admission: RE | Admit: 2024-01-02 | Discharge: 2024-01-02 | Disposition: A | Discharge status: Home / Self Care | Payer: Medicare Other | Source: Ambulatory Visit | Attending: Cardiology | Admitting: Cardiology

## 2024-01-02 DIAGNOSIS — I2510 Atherosclerotic heart disease of native coronary artery without angina pectoris: Secondary | ICD-10-CM | POA: Diagnosis not present

## 2024-01-02 DIAGNOSIS — I519 Heart disease, unspecified: Secondary | ICD-10-CM | POA: Insufficient documentation

## 2024-01-02 DIAGNOSIS — I493 Ventricular premature depolarization: Secondary | ICD-10-CM | POA: Insufficient documentation

## 2024-01-03 LAB — ECHOCARDIOGRAM COMPLETE
AR max vel: 2.15 cm2
AV Area VTI: 2.05 cm2
AV Area mean vel: 1.99 cm2
AV Mean grad: 3 mm[Hg]
AV Peak grad: 6.1 mm[Hg]
Ao pk vel: 1.23 m/s
Area-P 1/2: 3.39 cm2
Calc EF: 49.3 %
MV M vel: 2 m/s
MV Peak grad: 16 mm[Hg]
P 1/2 time: 28843 ms
S' Lateral: 3.7 cm
Single Plane A2C EF: 52.4 %
Single Plane A4C EF: 49.1 %

## 2024-01-23 ENCOUNTER — Encounter: Payer: Self-pay | Admitting: Cardiology

## 2024-01-24 ENCOUNTER — Other Ambulatory Visit: Payer: Self-pay

## 2024-01-24 ENCOUNTER — Other Ambulatory Visit: Payer: Self-pay | Admitting: Family Medicine

## 2024-01-24 ENCOUNTER — Encounter: Payer: Self-pay | Admitting: Family Medicine

## 2024-01-24 DIAGNOSIS — N1832 Chronic kidney disease, stage 3b: Secondary | ICD-10-CM

## 2024-01-24 DIAGNOSIS — E785 Hyperlipidemia, unspecified: Secondary | ICD-10-CM

## 2024-01-24 NOTE — Telephone Encounter (Signed)
Called pt was scheduled for lab appt, labs ordered.

## 2024-01-26 ENCOUNTER — Encounter: Payer: Self-pay | Admitting: Family Medicine

## 2024-01-27 ENCOUNTER — Telehealth: Payer: Self-pay

## 2024-01-27 ENCOUNTER — Other Ambulatory Visit: Payer: Self-pay

## 2024-01-27 DIAGNOSIS — N1832 Chronic kidney disease, stage 3b: Secondary | ICD-10-CM

## 2024-01-27 DIAGNOSIS — E785 Hyperlipidemia, unspecified: Secondary | ICD-10-CM

## 2024-01-27 NOTE — Telephone Encounter (Signed)
Canceled CMP and a hep function panel, renal function panel, microalb/cr ratio (urine) and random urine test was ordered.

## 2024-01-27 NOTE — Addendum Note (Signed)
Addended by: Kathi Ludwig on: 01/27/2024 02:30 PM   Modules accepted: Orders

## 2024-01-27 NOTE — Addendum Note (Signed)
Addended byConrad Altamont D on: 01/27/2024 04:20 PM   Modules accepted: Orders

## 2024-01-30 ENCOUNTER — Other Ambulatory Visit: Payer: Self-pay | Admitting: *Deleted

## 2024-01-30 DIAGNOSIS — I7143 Infrarenal abdominal aortic aneurysm, without rupture: Secondary | ICD-10-CM

## 2024-02-01 ENCOUNTER — Other Ambulatory Visit: Payer: Medicare Other

## 2024-02-01 NOTE — Progress Notes (Deleted)
 HPI: FU cardiomyopathy and hypertension.  Patient previously seen for PVCs.  Exercise treadmill in December of 2014: Patient exercised for 6 minutes with no chest pain and no ST changes. Occasional PVCs noted. Echocardiogram in November 2014 showed normal LV function, grade 1 diastolic dysfunction, mild left atrial enlargement. Holter monitor in January of 2014 showed sinus rhythm with frequent PVCs, occasional couplet, occasional PAC and brief PAT. Mean heart rate 82. Abdominal ultrasound March 2024 showed abdominal aortic aneurysm measuring 3.6 cm.  There was also aneurysmal dilatation of the right common iliac artery at 2 cm.  Echocardiogram April 2024 showed ejection fraction 30 to 35%, grade 1 diastolic dysfunction.  I personally reviewed this and felt the study was technically difficult but LV function likely preserved.  Calcium score September 2024 502 which is 48th percentile.  Echocardiogram January 2025 technically difficult but ejection fraction estimated at 50 to 55% with grade 1 diastolic dysfunction.  Since last seen,   Current Outpatient Medications  Medication Sig Dispense Refill   allopurinol (ZYLOPRIM) 100 MG tablet TAKE 1 TABLET DAILY 90 tablet 1   atorvastatin (LIPITOR) 40 MG tablet TAKE 1 TABLET DAILY 90 tablet 3   hydrochlorothiazide (HYDRODIURIL) 12.5 MG tablet TAKE 1 TABLET DAILY 90 tablet 1   JARDIANCE 10 MG TABS tablet TAKE 1 TABLET DAILY BEFORE BREAKFAST 90 tablet 2   metoprolol succinate (TOPROL XL) 25 MG 24 hr tablet Take 1 tablet (25 mg total) by mouth at bedtime. 90 tablet 3   NON FORMULARY Take 120 mLs by mouth daily. Kefir probiotic otc     omeprazole (PRILOSEC) 10 MG capsule TAKE 1 CAPSULE DAILY 90 capsule 1   telmisartan (MICARDIS) 40 MG tablet TAKE 1 TABLET DAILY 90 tablet 1   No current facility-administered medications for this visit.     Past Medical History:  Diagnosis Date   BPH (benign prostatic hypertrophy)    GERD (gastroesophageal reflux  disease)    HOH (hard of hearing)    both ears   Hyperlipidemia    Hypertension    Normal cardiac stress test 11/28/13   Treadmill   PVC (premature ventricular contraction)     Past Surgical History:  Procedure Laterality Date   basal cell skin areas removed     more than 1 removed   CATARACT EXTRACTION, BILATERAL     left eye poor vision now   CYSTOSCOPY N/A 03/06/2018   Procedure: CYSTOSCOPY;  Surgeon: Heloise Purpura, MD;  Location: [facility];  Service: Urology;  Laterality: N/A;   HAND SURGERY     right   squamous cell skin pre cancer areas removed     x 1   TONSILLECTOMY     TRANSURETHRAL RESECTION OF PROSTATE N/A 03/06/2018   Procedure: TRANSURETHRAL RESECTION OF THE PROSTATE (TURP);  Surgeon: Heloise Purpura, MD;  Location: [facility];  Service: Urology;  Laterality: N/A;   TRANSURETHRAL RESECTION OF PROSTATE  01/06/2021    Social History   Socioeconomic History   Marital status: Widowed    Spouse name: Not on file   Number of children: 2   Years of education: Not on file   Highest education level: Not on file  Occupational History   Not on file  Tobacco Use   Smoking status: Former    Current packs/day: 0.00    Average packs/day: 1.5 packs/day for 45.0 years (67.5 ttl pk-yrs)    Types: Cigarettes    Start date: 08/16/1954    Quit date: 08/17/1999  Years since quitting: 24.4   Smokeless tobacco: Never  Vaping Use   Vaping status: Never Used  Substance and Sexual Activity   Alcohol use: Yes    Alcohol/week: 5.0 standard drinks of alcohol    Types: 5 Glasses of wine per week    Comment: glass of wine per day   Drug use: No   Sexual activity: Not Currently  Other Topics Concern   Not on file  Social History Narrative   Not on file   Social Drivers of Health   Financial Resource Strain: Low Risk  (10/18/2022)   Overall Financial Resource Strain (CARDIA)    Difficulty of Paying Living Expenses: Not hard at all  Food Insecurity: No Food Insecurity (10/18/2022)    Hunger Vital Sign    Worried About Running Out of Food in the Last Year: Never true    Ran Out of Food in the Last Year: Never true  Transportation Needs: No Transportation Needs (10/18/2022)   PRAPARE - Administrator, Civil Service (Medical): No    Lack of Transportation (Non-Medical): No  Physical Activity: Inactive (10/18/2022)   Exercise Vital Sign    Days of Exercise per Week: 0 days    Minutes of Exercise per Session: 0 min  Stress: No Stress Concern Present (10/18/2022)   [employer] of Occupational Health - Occupational Stress Questionnaire    Feeling of Stress : Not at all  Social Connections: Socially Isolated (10/18/2022)   Social Connection and Isolation Panel [NHANES]    Frequency of Communication with Friends and Family: More than three times a week    Frequency of Social Gatherings with Friends and Family: More than three times a week    Attends Religious Services: Never    Database administrator or Organizations: No    Attends Banker Meetings: Never    Marital Status: Widowed  Intimate Partner Violence: Not At Risk (10/18/2022)   Humiliation, Afraid, Rape, and Kick questionnaire    Fear of Current or Ex-Partner: No    Emotionally Abused: No    Physically Abused: No    Sexually Abused: No    Family History  Problem Relation Age of Onset   Heart Problems Mother    Heart disease Father        Pacemaker    ROS: no fevers or chills, productive cough, hemoptysis, dysphasia, odynophagia, melena, hematochezia, dysuria, hematuria, rash, seizure activity, orthopnea, PND, pedal edema, claudication. Remaining systems are negative.  Physical Exam: Well-developed well-nourished in no acute distress.  Skin is warm and dry.  HEENT is normal.  Neck is supple.  Chest is clear to auscultation with normal expansion.  Cardiovascular exam is regular rate and rhythm.  Abdominal exam nontender or distended. No masses palpated. Extremities show  no edema. neuro grossly intact  ECG- personally reviewed  A/P  1 cardiomyopathy-most recent echocardiogram showed low normal LV function though technically difficult.  Will continue ARB and beta-blocker.  Patient would like to be conservative given his age.  2 hypertension-blood pressure controlled.  Continue present medical regimen.  3 abdominal aortic aneurysm-he will need follow-up ultrasound March 2025.  4 PVCs-continue Toprol at present dose.  5 hyperlipidemia-continue statin.  6 coronary calcification-he denies chest pain.  Continue aspirin and statin.  Olga Millers, MD

## 2024-02-03 ENCOUNTER — Telehealth (HOSPITAL_BASED_OUTPATIENT_CLINIC_OR_DEPARTMENT_OTHER): Payer: Self-pay

## 2024-02-06 ENCOUNTER — Other Ambulatory Visit (INDEPENDENT_AMBULATORY_CARE_PROVIDER_SITE_OTHER): Payer: Medicare Other

## 2024-02-06 ENCOUNTER — Other Ambulatory Visit: Payer: Medicare Other

## 2024-02-06 DIAGNOSIS — N1832 Chronic kidney disease, stage 3b: Secondary | ICD-10-CM | POA: Diagnosis not present

## 2024-02-06 DIAGNOSIS — E785 Hyperlipidemia, unspecified: Secondary | ICD-10-CM

## 2024-02-06 LAB — CBC WITH DIFFERENTIAL/PLATELET
Basophils Absolute: 0 10*3/uL (ref 0.0–0.1)
Basophils Relative: 0.5 % (ref 0.0–3.0)
Eosinophils Absolute: 0.1 10*3/uL (ref 0.0–0.7)
Eosinophils Relative: 2.6 % (ref 0.0–5.0)
HCT: 43.8 % (ref 39.0–52.0)
Hemoglobin: 14.5 g/dL (ref 13.0–17.0)
Lymphocytes Relative: 35.4 % (ref 12.0–46.0)
Lymphs Abs: 2 10*3/uL (ref 0.7–4.0)
MCHC: 33.1 g/dL (ref 30.0–36.0)
MCV: 98.3 fL (ref 78.0–100.0)
Monocytes Absolute: 0.4 10*3/uL (ref 0.1–1.0)
Monocytes Relative: 7 % (ref 3.0–12.0)
Neutro Abs: 3.2 10*3/uL (ref 1.4–7.7)
Neutrophils Relative %: 54.5 % (ref 43.0–77.0)
Platelets: 203 10*3/uL (ref 150.0–400.0)
RBC: 4.45 Mil/uL (ref 4.22–5.81)
RDW: 14 % (ref 11.5–15.5)
WBC: 5.8 10*3/uL (ref 4.0–10.5)

## 2024-02-06 LAB — RENAL FUNCTION PANEL
Albumin: 4 g/dL (ref 3.5–5.2)
BUN: 32 mg/dL — ABNORMAL HIGH (ref 6–23)
CO2: 30 meq/L (ref 19–32)
Calcium: 8.8 mg/dL (ref 8.4–10.5)
Chloride: 103 meq/L (ref 96–112)
Creatinine, Ser: 2.1 mg/dL — ABNORMAL HIGH (ref 0.40–1.50)
GFR: 27.43 mL/min — ABNORMAL LOW (ref >60.00)
Glucose, Bld: 104 mg/dL — ABNORMAL HIGH (ref 70–99)
Phosphorus: 3.1 mg/dL (ref 2.3–4.6)
Potassium: 4.7 meq/L (ref 3.5–5.1)
Sodium: 142 meq/L (ref 135–145)

## 2024-02-06 LAB — LIPID PANEL
Cholesterol: 116 mg/dL (ref 0–200)
HDL: 45.4 mg/dL (ref >39.00)
LDL Cholesterol: 53 mg/dL (ref 0–99)
NonHDL: 70.22
Total CHOL/HDL Ratio: 3
Triglycerides: 85 mg/dL (ref 0.0–149.0)
VLDL: 17 mg/dL (ref 0.0–40.0)

## 2024-02-06 LAB — VITAMIN D 25 HYDROXY (VIT D DEFICIENCY, FRACTURES): VITD: 44.36 ng/mL (ref 30.00–100.00)

## 2024-02-06 LAB — HEPATIC FUNCTION PANEL
ALT: 10 U/L (ref 0–53)
AST: 14 U/L (ref 0–37)
Albumin: 4 g/dL (ref 3.5–5.2)
Alkaline Phosphatase: 64 U/L (ref 39–117)
Bilirubin, Direct: 0.2 mg/dL (ref 0.0–0.3)
Total Bilirubin: 0.8 mg/dL (ref 0.2–1.2)
Total Protein: 6.6 g/dL (ref 6.0–8.3)

## 2024-02-06 NOTE — Addendum Note (Signed)
 Addended by: Rosendo Conroy on: 02/06/2024 08:42 AM   Modules accepted: Orders

## 2024-02-07 ENCOUNTER — Encounter: Payer: Self-pay | Admitting: Family Medicine

## 2024-02-07 LAB — PARATHYROID HORMONE, INTACT (NO CA): PTH: 81 pg/mL — ABNORMAL HIGH (ref 16–77)

## 2024-02-08 ENCOUNTER — Ambulatory Visit (INDEPENDENT_AMBULATORY_CARE_PROVIDER_SITE_OTHER): Payer: Medicare Other

## 2024-02-08 ENCOUNTER — Ambulatory Visit (INDEPENDENT_AMBULATORY_CARE_PROVIDER_SITE_OTHER): Payer: Medicare Other | Admitting: Family Medicine

## 2024-02-08 ENCOUNTER — Other Ambulatory Visit: Payer: Medicare Other

## 2024-02-08 ENCOUNTER — Encounter: Payer: Self-pay | Admitting: Family Medicine

## 2024-02-08 VITALS — BP 110/74 | HR 80 | Temp 97.8°F | Resp 16 | Ht 73.0 in | Wt 185.4 lb

## 2024-02-08 DIAGNOSIS — M1A09X0 Idiopathic chronic gout, multiple sites, without tophus (tophi): Secondary | ICD-10-CM

## 2024-02-08 DIAGNOSIS — N1832 Chronic kidney disease, stage 3b: Secondary | ICD-10-CM

## 2024-02-08 DIAGNOSIS — E7800 Pure hypercholesterolemia, unspecified: Secondary | ICD-10-CM | POA: Diagnosis not present

## 2024-02-08 DIAGNOSIS — R7303 Prediabetes: Secondary | ICD-10-CM

## 2024-02-08 DIAGNOSIS — I10 Essential (primary) hypertension: Secondary | ICD-10-CM

## 2024-02-08 LAB — URINALYSIS, ROUTINE W REFLEX MICROSCOPIC
Bilirubin Urine: NEGATIVE
Hgb urine dipstick: NEGATIVE
Ketones, ur: NEGATIVE
Leukocytes,Ua: NEGATIVE
Nitrite: NEGATIVE
RBC / HPF: NONE SEEN (ref 0–?)
Specific Gravity, Urine: 1.01 (ref 1.000–1.030)
Total Protein, Urine: NEGATIVE
Urine Glucose: 500 — AB
Urobilinogen, UA: 0.2 (ref 0.0–1.0)
WBC, UA: NONE SEEN (ref 0–?)
pH: 6 (ref 5.0–8.0)

## 2024-02-08 LAB — URIC ACID: Uric Acid, Serum: 6.9 mg/dL (ref 4.0–7.8)

## 2024-02-08 NOTE — Patient Instructions (Signed)
Keep the diet clean and stay active.  If you ever want to work with physical therapy to help build strength/endurance, let me know.   Let us know if you need anything.

## 2024-02-08 NOTE — Progress Notes (Signed)
Chief Complaint  Patient presents with   Medication Refill    Medication check     Subjective Steven Pearson is a 89 y.o. male who presents for hypertension follow up. He does monitor home blood pressures. Blood pressures ranging from 110-'s/70's on average. He is compliant with medication- Micardis 40 mg/d, hydrochlorothiazide 12.5 mg/d. Patient has these side effects of medication: none He is sometimes adhering to a healthy diet overall. Current exercise: walking No CP or SOB.   Hyperlipidemia Patient presents for dyslipidemia follow up. Currently being treated with Lipitor 40 mg/d and compliance with treatment thus far has been good. He denies myalgias. Diet/exercise as above.  The patient is not known to have coexisting coronary artery disease.  Gout No recent flares >1 yr. Compliant with allopurinol 100 mg/d.  No adverse effects.   Past Medical History:  Diagnosis Date   BPH (benign prostatic hypertrophy)    GERD (gastroesophageal reflux disease)    HOH (hard of hearing)    both ears   Hyperlipidemia    Hypertension    Normal cardiac stress test 11/28/13   Treadmill   PVC (premature ventricular contraction)     Exam BP 110/74 (BP Location: Left Arm, Patient Position: Sitting)   Pulse 80   Temp 97.8 F (36.6 C) (Oral)   Resp 16   Ht 6\' 1"  (1.854 m)   Wt 185 lb 6.4 oz (84.1 kg)   SpO2 96%   BMI 24.46 kg/m  General:  well developed, well nourished, in no apparent distress Heart: RRR, no bruits, 2+ pitting bilateral LE edema around ankles Lungs: clear to auscultation, no accessory muscle use Psych: well oriented with normal range of affect and appropriate judgment/insight  Essential hypertension, benign  Pure hypercholesterolemia  Chronic gout of multiple sites, unspecified cause  Stage 3b chronic kidney disease (HCC) - Plan: Microalbumin / creatinine urine ratio, Urinalysis, Routine w reflex microscopic  Chronic, stable.  Continue Micardis 40 mg daily,  hydrochlorothiazide 12.5 mg daily.  Counseled on diet and exercise. Chronic, stable.  Continue Lipitor 40 mg daily. Chronic, stable.  Continue allopurinol 100 mg daily. Appreciate nephrology input. F/u in 6 months, labs 1 week prior. The patient voiced understanding and agreement to the plan.  Jilda Roche [place], DO 02/08/24  10:23 AM

## 2024-02-09 ENCOUNTER — Encounter: Payer: Self-pay | Admitting: Family Medicine

## 2024-02-09 DIAGNOSIS — M898X9 Other specified disorders of bone, unspecified site: Secondary | ICD-10-CM | POA: Diagnosis not present

## 2024-02-09 DIAGNOSIS — N183 Chronic kidney disease, stage 3 unspecified: Secondary | ICD-10-CM | POA: Diagnosis not present

## 2024-02-09 DIAGNOSIS — R801 Persistent proteinuria, unspecified: Secondary | ICD-10-CM | POA: Diagnosis not present

## 2024-02-09 DIAGNOSIS — E559 Vitamin D deficiency, unspecified: Secondary | ICD-10-CM | POA: Diagnosis not present

## 2024-02-09 DIAGNOSIS — I129 Hypertensive chronic kidney disease with stage 1 through stage 4 chronic kidney disease, or unspecified chronic kidney disease: Secondary | ICD-10-CM | POA: Diagnosis not present

## 2024-02-09 DIAGNOSIS — R7989 Other specified abnormal findings of blood chemistry: Secondary | ICD-10-CM | POA: Diagnosis not present

## 2024-02-09 LAB — MICROALBUMIN / CREATININE URINE RATIO
Creatinine,U: 72.7 mg/dL
Microalb Creat Ratio: 49.2 mg/g — ABNORMAL HIGH (ref 0.0–30.0)
Microalb, Ur: 3.6 mg/dL — ABNORMAL HIGH (ref 0.0–1.9)

## 2024-02-14 ENCOUNTER — Other Ambulatory Visit: Payer: Self-pay | Admitting: Family Medicine

## 2024-02-14 DIAGNOSIS — I10 Essential (primary) hypertension: Secondary | ICD-10-CM

## 2024-02-15 ENCOUNTER — Ambulatory Visit: Payer: Medicare Other | Admitting: Cardiology

## 2024-02-18 ENCOUNTER — Emergency Department (HOSPITAL_BASED_OUTPATIENT_CLINIC_OR_DEPARTMENT_OTHER)
Admission: EM | Admit: 2024-02-18 | Discharge: 2024-02-18 | Disposition: A | Discharge status: Home / Self Care | Payer: Medicare Other | Attending: Emergency Medicine | Admitting: Emergency Medicine

## 2024-02-18 ENCOUNTER — Other Ambulatory Visit: Payer: Self-pay

## 2024-02-18 ENCOUNTER — Encounter (HOSPITAL_BASED_OUTPATIENT_CLINIC_OR_DEPARTMENT_OTHER): Payer: Self-pay | Admitting: Emergency Medicine

## 2024-02-18 DIAGNOSIS — N1832 Chronic kidney disease, stage 3b: Secondary | ICD-10-CM | POA: Diagnosis not present

## 2024-02-18 DIAGNOSIS — Z7984 Long term (current) use of oral hypoglycemic drugs: Secondary | ICD-10-CM | POA: Insufficient documentation

## 2024-02-18 DIAGNOSIS — I959 Hypotension, unspecified: Secondary | ICD-10-CM | POA: Diagnosis not present

## 2024-02-18 DIAGNOSIS — E860 Dehydration: Secondary | ICD-10-CM | POA: Diagnosis not present

## 2024-02-18 DIAGNOSIS — N189 Chronic kidney disease, unspecified: Secondary | ICD-10-CM | POA: Diagnosis not present

## 2024-02-18 DIAGNOSIS — I129 Hypertensive chronic kidney disease with stage 1 through stage 4 chronic kidney disease, or unspecified chronic kidney disease: Secondary | ICD-10-CM | POA: Insufficient documentation

## 2024-02-18 LAB — COMPREHENSIVE METABOLIC PANEL
ALT: 13 U/L (ref 0–44)
AST: 20 U/L (ref 15–41)
Albumin: 3.9 g/dL (ref 3.5–5.0)
Alkaline Phosphatase: 56 U/L (ref 38–126)
Anion gap: 10 (ref 5–15)
BUN: 37 mg/dL — ABNORMAL HIGH (ref 8–23)
CO2: 24 mmol/L (ref 22–32)
Calcium: 9.2 mg/dL (ref 8.9–10.3)
Chloride: 102 mmol/L (ref 98–111)
Creatinine, Ser: 2.1 mg/dL — ABNORMAL HIGH (ref 0.61–1.24)
GFR, Estimated: 30 mL/min — ABNORMAL LOW (ref >60)
Glucose, Bld: 107 mg/dL — ABNORMAL HIGH (ref 70–99)
Potassium: 5 mmol/L (ref 3.5–5.1)
Sodium: 136 mmol/L (ref 135–145)
Total Bilirubin: 0.9 mg/dL (ref 0.0–1.2)
Total Protein: 6.9 g/dL (ref 6.5–8.1)

## 2024-02-18 LAB — CBC WITH DIFFERENTIAL/PLATELET
Abs Immature Granulocytes: 0.02 10*3/uL (ref 0.00–0.07)
Basophils Absolute: 0 10*3/uL (ref 0.0–0.1)
Basophils Relative: 1 %
Eosinophils Absolute: 0.1 10*3/uL (ref 0.0–0.5)
Eosinophils Relative: 2 %
HCT: 42.6 % (ref 39.0–52.0)
Hemoglobin: 14 g/dL (ref 13.0–17.0)
Immature Granulocytes: 0 %
Lymphocytes Relative: 27 %
Lymphs Abs: 1.5 10*3/uL (ref 0.7–4.0)
MCH: 31.8 pg (ref 26.0–34.0)
MCHC: 32.9 g/dL (ref 30.0–36.0)
MCV: 96.8 fL (ref 80.0–100.0)
Monocytes Absolute: 0.5 10*3/uL (ref 0.1–1.0)
Monocytes Relative: 8 %
Neutro Abs: 3.5 10*3/uL (ref 1.7–7.7)
Neutrophils Relative %: 62 %
Platelets: 166 10*3/uL (ref 150–400)
RBC: 4.4 MIL/uL (ref 4.22–5.81)
RDW: 13.2 % (ref 11.5–15.5)
WBC: 5.7 10*3/uL (ref 4.0–10.5)
nRBC: 0 % (ref 0.0–0.2)

## 2024-02-18 NOTE — ED Notes (Signed)
 Fall risk armband Fall risk sign on door Patient wearing shoes

## 2024-02-18 NOTE — ED Notes (Signed)
 Pt discharged by provider. Paperwork reviewed at the same time, while EDP was in the room.

## 2024-02-18 NOTE — Discharge Instructions (Addendum)
 I suspect your blood pressures at home are secondary to your dietary changes, eating and drinking less.  Your blood pressures here are normal.  For now, we will have you discontinue your 12.5mg  of hydrochlorothiazide and discuss with your doctor and nephrologist.

## 2024-02-18 NOTE — ED Provider Notes (Signed)
 [facility] Provider Note   CSN: [id number] Arrival date & time: 02/18/24  1117     History {Add pertinent medical, surgical, social history, OB history to HPI:1} Chief Complaint  Patient presents with   Hypotension    Steven Pearson is a 89 y.o. male.  HPI      89 year old male with a history of hypertension, hyperlipidemia, BPH, CKD stage III    Recently saw nephrology and noted to have creatinine at the upper limit of baseline range.  They thought it was a likely drug interaction with Jardiance, telmisartan and HCTZ.  They recommended switching HD CTZ to low-dose torsemide 10 mg as needed for ankle swelling.j  Patient states he has still been taking hydrochlorothiazide and not taken torsemide.   Has been adjusting diet to try to be easier on the kidneys.  Less salt, less sugar, not eating as much for a few weeks   For the past 2 days has been getting low blood pressures on home monitor, down to 80s systolic last night. Has not taken hydrochlorothiazide in past 36 hours because of BP This morning did not take any of blood pressure medicines.  No fever, cough, nausea, vomiting, diarrhea, black or bloody stools.  Just got over a cold a week ago.  No chest pain. No new dyspnea.    No new medications--has not taken the torsemide yet   Past Medical History:  Diagnosis Date   BPH (benign prostatic hypertrophy)    GERD (gastroesophageal reflux disease)    HOH (hard of hearing)    both ears   Hyperlipidemia    Hypertension    Normal cardiac stress test 11/28/13   Treadmill   PVC (premature ventricular contraction)      Home Medications Prior to Admission medications   Medication Sig Start Date End Date Taking? Authorizing Provider  allopurinol (ZYLOPRIM) 100 MG tablet TAKE 1 TABLET DAILY 09/05/23   Sharlene Dory, DO  atorvastatin (LIPITOR) 40 MG tablet TAKE 1 TABLET DAILY 12/05/23   Sharlene Dory, DO   hydrochlorothiazide (HYDRODIURIL) 12.5 MG tablet TAKE 1 TABLET DAILY 02/14/24   Sharlene Dory, DO  JARDIANCE 10 MG TABS tablet TAKE 1 TABLET DAILY BEFORE BREAKFAST 09/05/23   Wendling, Jilda Roche, DO  metoprolol succinate (TOPROL XL) 25 MG 24 hr tablet Take 1 tablet (25 mg total) by mouth at bedtime. 08/31/23   Lewayne Bunting, MD  NON FORMULARY Take 120 mLs by mouth daily. Kefir probiotic otc    [provider]  omeprazole (PRILOSEC) 10 MG capsule TAKE 1 CAPSULE DAILY 02/14/24   Sharlene Dory, DO  telmisartan (MICARDIS) 40 MG tablet TAKE 1 TABLET DAILY 12/05/23   Carmelia Roller, Jilda Roche, DO      Allergies    Nitrofuran derivatives    Review of Systems   Review of Systems  Physical Exam Updated Vital Signs BP 119/86 (BP Location: Right Arm)   Pulse 91   Temp (!) 97.2 F (36.2 C) (Tympanic)   Resp 17   Ht 6' (1.829 m)   Wt 79.4 kg   SpO2 99%   BMI 23.73 kg/m  Physical Exam  ED Results / Procedures / Treatments   Labs (all labs ordered are listed, but only abnormal results are displayed) Labs Reviewed - No data to display  EKG None  Radiology No results found.  Procedures Procedures  {Document cardiac monitor, telemetry assessment procedure when appropriate:1}  Medications Ordered in ED Medications -  No data to display  ED Course/ Medical Decision Making/ A&P   {   Click here for ABCD2, HEART and other calculatorsREFRESH Note before signing :1}                              Medical Decision Making Amount and/or Complexity of Data Reviewed Labs: ordered.   ***  {Document critical care time when appropriate:1} {Document review of labs and clinical decision tools ie heart score, Chads2Vasc2 etc:1}  {Document your independent review of radiology images, and any outside records:1} {Document your discussion with family members, caretakers, and with consultants:1} {Document social determinants of health affecting pt's care:1} {Document  your decision making why or why not admission, treatments were needed:1} Final Clinical Impression(s) / ED Diagnoses Final diagnoses:  None    Rx / DC Orders ED Discharge Orders     None

## 2024-02-18 NOTE — ED Triage Notes (Signed)
 Pt states he has been getting low BP readings at home since yesterday.  Pt states he stopped his BP medication today.  Last PCP visit 2 weeks ago.  Pt is alert and oriented in triage.

## 2024-02-21 ENCOUNTER — Other Ambulatory Visit: Payer: Self-pay | Admitting: Family Medicine

## 2024-02-29 ENCOUNTER — Encounter: Payer: Self-pay | Admitting: Family Medicine

## 2024-03-25 ENCOUNTER — Other Ambulatory Visit: Payer: Self-pay | Admitting: Family Medicine

## 2024-04-18 ENCOUNTER — Ambulatory Visit (HOSPITAL_BASED_OUTPATIENT_CLINIC_OR_DEPARTMENT_OTHER)
Admission: RE | Admit: 2024-04-18 | Discharge: 2024-04-18 | Disposition: A | Discharge status: Home / Self Care | Payer: Medicare Other | Source: Ambulatory Visit | Attending: Cardiology | Admitting: Cardiology

## 2024-04-18 DIAGNOSIS — I7143 Infrarenal abdominal aortic aneurysm, without rupture: Secondary | ICD-10-CM | POA: Diagnosis not present

## 2024-04-20 ENCOUNTER — Encounter: Payer: Self-pay | Admitting: Cardiology

## 2024-04-23 ENCOUNTER — Encounter: Payer: Self-pay | Admitting: *Deleted

## 2024-04-23 NOTE — Progress Notes (Signed)
 HPI: FU CAD, AAA and hypertension.  Patient previously seen for PVCs.  Exercise treadmill in December of 2014: Patient exercised for 6 minutes with no chest pain and no ST changes. Occasional PVCs noted. Echocardiogram in November 2014 showed normal LV function, grade 1 diastolic dysfunction, mild left atrial enlargement. Holter monitor in January of 2014 showed sinus rhythm with frequent PVCs, occasional couplet, occasional PAC and brief PAT. Mean heart rate 82. Calcium  score September 2024 502 which was 48 percentile.  Follow-up echocardiogram January 2025 showed normal LV function, grade 1 diastolic dysfunction.  Abdominal ultrasound April 2025 showed 3.5 cm abdominal aortic aneurysm.  Since last seen, he has dyspnea with more vigorous activities but not routine activities.  No chest pain or syncope.  He had an episode when his heart rate was in the 120s and he felt this could have been atrial fibrillation though not documented.  He also states his Apple Watch told him that he had an episode of atrial fibrillation once.  Current Outpatient Medications  Medication Sig Dispense Refill   allopurinol  (ZYLOPRIM ) 100 MG tablet TAKE 1 TABLET DAILY 90 tablet 1   atorvastatin  (LIPITOR) 40 MG tablet TAKE 1 TABLET DAILY 90 tablet 3   empagliflozin  (JARDIANCE ) 10 MG TABS tablet TAKE 1 TABLET DAILY BEFORE BREAKFAST 90 tablet 3   hydrochlorothiazide  (HYDRODIURIL ) 12.5 MG tablet TAKE 1 TABLET DAILY 90 tablet 1   metoprolol  succinate (TOPROL  XL) 25 MG 24 hr tablet Take 1 tablet (25 mg total) by mouth at bedtime. 90 tablet 3   NON FORMULARY Take 120 mLs by mouth daily. Kefir probiotic otc     omeprazole  (PRILOSEC) 10 MG capsule TAKE 1 CAPSULE DAILY 90 capsule 1   telmisartan  (MICARDIS ) 40 MG tablet TAKE 1 TABLET DAILY 90 tablet 1   No current facility-administered medications for this visit.     Past Medical History:  Diagnosis Date   BPH (benign prostatic hypertrophy)    GERD (gastroesophageal reflux  disease)    HOH (hard of hearing)    both ears   Hyperlipidemia    Hypertension    Normal cardiac stress test 11/28/13   Treadmill   PVC (premature ventricular contraction)     Past Surgical History:  Procedure Laterality Date   basal cell skin areas removed     more than 1 removed   CATARACT EXTRACTION, BILATERAL     left eye poor vision now   CYSTOSCOPY N/A 03/06/2018   Procedure: CYSTOSCOPY;  Surgeon: Florencio Hunting, MD;  Location: [facility];  Service: Urology;  Laterality: N/A;   HAND SURGERY     right   squamous cell skin pre cancer areas removed     x 1   TONSILLECTOMY     TRANSURETHRAL RESECTION OF PROSTATE N/A 03/06/2018   Procedure: TRANSURETHRAL RESECTION OF THE PROSTATE (TURP);  Surgeon: Florencio Hunting, MD;  Location: [facility];  Service: Urology;  Laterality: N/A;   TRANSURETHRAL RESECTION OF PROSTATE  01/06/2021    Social History   Socioeconomic History   Marital status: Widowed    Spouse name: Not on file   Number of children: 2   Years of education: Not on file   Highest education level: Not on file  Occupational History   Not on file  Tobacco Use   Smoking status: Former    Current packs/day: 0.00    Average packs/day: 1.5 packs/day for 45.0 years (67.5 ttl pk-yrs)    Types: Cigarettes    Start date:  08/16/1954    Quit date: 08/17/1999    Years since quitting: 24.7   Smokeless tobacco: Never  Vaping Use   Vaping status: Never Used  Substance and Sexual Activity   Alcohol use: Yes    Alcohol/week: 5.0 standard drinks of alcohol    Types: 5 Glasses of wine per week    Comment: glass of wine per day   Drug use: No   Sexual activity: Not Currently  Other Topics Concern   Not on file  Social History Narrative   Not on file   Social Drivers of Health   Financial Resource Strain: Low Risk  (10/18/2022)   Overall Financial Resource Strain (CARDIA)    Difficulty of Paying Living Expenses: Not hard at all  Food Insecurity: No Food Insecurity (10/18/2022)    Hunger Vital Sign    Worried About Running Out of Food in the Last Year: Never true    Ran Out of Food in the Last Year: Never true  Transportation Needs: No Transportation Needs (10/18/2022)   PRAPARE - Administrator, Civil Service (Medical): No    Lack of Transportation (Non-Medical): No  Physical Activity: Inactive (10/18/2022)   Exercise Vital Sign    Days of Exercise per Week: 0 days    Minutes of Exercise per Session: 0 min  Stress: No Stress Concern Present (10/18/2022)   [employer] of Occupational Health - Occupational Stress Questionnaire    Feeling of Stress : Not at all  Social Connections: Socially Isolated (10/18/2022)   Social Connection and Isolation Panel [NHANES]    Frequency of Communication with Friends and Family: More than three times a week    Frequency of Social Gatherings with Friends and Family: More than three times a week    Attends Religious Services: Never    Database administrator or Organizations: No    Attends Banker Meetings: Never    Marital Status: Widowed  Intimate Partner Violence: Not At Risk (10/18/2022)   Humiliation, Afraid, Rape, and Kick questionnaire    Fear of Current or Ex-Partner: No    Emotionally Abused: No    Physically Abused: No    Sexually Abused: No    Family History  Problem Relation Age of Onset   Heart Problems Mother    Heart disease Father        Pacemaker    ROS: no fevers or chills, productive cough, hemoptysis, dysphasia, odynophagia, melena, hematochezia, dysuria, hematuria, rash, seizure activity, orthopnea, PND, pedal edema, claudication. Remaining systems are negative.  Physical Exam: Well-developed well-nourished in no acute distress.  Skin is warm and dry.  HEENT is normal.  Neck is supple.  Chest is clear to auscultation with normal expansion.  Cardiovascular exam is regular rate and rhythm.  Abdominal exam nontender or distended. No masses palpated. Extremities show  no edema. neuro grossly intact   A/P  1 hypertension-blood pressure controlled.  Continue present medications.  2 history of question reduced LV function-follow-up echocardiogram showed preserved LV function.  Continue ARB and beta-blocker.  3 hyperlipidemia-continue statin.  4 abdominal aortic aneurysm-plan follow-up ultrasound April 2027.  5 PVCs-continue beta-blocker.  6 coronary calcification-continue aspirin  and statin.  Patient denies chest pain.  7 palpitations-patient had an episode of heart rate in the 120s with question atrial fibrillation.  I will ask you to document any strips with his Apple Watch associated with symptoms.  He also stated that he could not be on a blood thinner as he  has had recurrent nosebleeds.  Will have to review discuss that further if atrial fibrillation is documented in the future.  Alexandria Angel, MD

## 2024-05-02 ENCOUNTER — Ambulatory Visit: Payer: Medicare Other | Attending: Cardiology | Admitting: Cardiology

## 2024-05-02 ENCOUNTER — Encounter: Payer: Self-pay | Admitting: Cardiology

## 2024-05-02 VITALS — BP 122/60 | HR 73 | Ht 72.0 in | Wt 183.0 lb

## 2024-05-02 DIAGNOSIS — E7800 Pure hypercholesterolemia, unspecified: Secondary | ICD-10-CM | POA: Insufficient documentation

## 2024-05-02 DIAGNOSIS — I7143 Infrarenal abdominal aortic aneurysm, without rupture: Secondary | ICD-10-CM | POA: Insufficient documentation

## 2024-05-02 DIAGNOSIS — I519 Heart disease, unspecified: Secondary | ICD-10-CM | POA: Insufficient documentation

## 2024-05-02 DIAGNOSIS — I2510 Atherosclerotic heart disease of native coronary artery without angina pectoris: Secondary | ICD-10-CM | POA: Diagnosis not present

## 2024-05-02 NOTE — Patient Instructions (Signed)
   Follow-Up: At [facility], you and your health needs are our priority.  As part of our continuing mission to provide you with exceptional heart care, our providers are all part of one team.  This team includes your primary Cardiologist (physician) and Advanced Practice Providers or APPs (Physician Assistants and Nurse Practitioners) who all work together to provide you with the care you need, when you need it.  Your next appointment:   12 month(s)  Provider:   Alexandria Angel MD

## 2024-05-04 ENCOUNTER — Encounter: Payer: Self-pay | Admitting: Cardiology

## 2024-05-08 ENCOUNTER — Other Ambulatory Visit: Payer: Self-pay

## 2024-05-08 MED ORDER — METOPROLOL SUCCINATE ER 25 MG PO TB24: 25.0000 mg | ORAL_TABLET | Freq: Every day | ORAL | 3 refills | Status: DC

## 2024-05-16 ENCOUNTER — Encounter: Payer: Self-pay | Admitting: Cardiology

## 2024-05-18 ENCOUNTER — Encounter: Payer: Self-pay | Admitting: Cardiology

## 2024-05-22 MED ORDER — METOPROLOL SUCCINATE ER 25 MG PO TB24: 12.5000 mg | ORAL_TABLET | Freq: Every day | ORAL | Status: DC

## 2024-06-05 ENCOUNTER — Encounter: Payer: Self-pay | Admitting: Family Medicine

## 2024-06-05 MED ORDER — EMPAGLIFLOZIN 10 MG PO TABS: 10.0000 mg | ORAL_TABLET | Freq: Every day | ORAL | 3 refills | Status: AC

## 2024-06-07 ENCOUNTER — Telehealth: Payer: Self-pay

## 2024-06-07 NOTE — Telephone Encounter (Signed)
 Opened in error

## 2024-06-11 ENCOUNTER — Other Ambulatory Visit (HOSPITAL_COMMUNITY): Payer: Self-pay

## 2024-06-11 ENCOUNTER — Telehealth: Payer: Self-pay

## 2024-06-11 NOTE — Telephone Encounter (Signed)
 Message sent to pt  about Jardiance  10mg  tabs has been APPROVED  .

## 2024-06-11 NOTE — Telephone Encounter (Signed)
 Pharmacy Patient Advocate Encounter   Received notification from Patient Advice Request messages that prior authorization for Jardiance  10mg  tabs is required/requested.   Insurance verification completed.   The patient is insured through [facility] .   Per test claim: PA required; PA submitted to above mentioned insurance via CoverMyMeds Key/confirmation #/EOC [id number] Status is pending

## 2024-06-11 NOTE — Telephone Encounter (Signed)
 Pharmacy Patient Advocate Encounter  Received notification from [facility] that Prior Authorization for Jardiance  10mg  tabs has been APPROVED from 05/12/24 to 06/11/25. Unable to obtain price due to refill too soon rejection, last fill date 04/29/24 next available fill date 07/06/24   PA #/Case ID/Reference #: [id number]

## 2024-06-12 ENCOUNTER — Ambulatory Visit (INDEPENDENT_AMBULATORY_CARE_PROVIDER_SITE_OTHER): Admitting: Family Medicine

## 2024-06-12 ENCOUNTER — Encounter: Payer: Self-pay | Admitting: Family Medicine

## 2024-06-12 VITALS — BP 120/64 | HR 83 | Temp 98.0°F | Resp 16 | Ht 73.0 in | Wt 182.0 lb

## 2024-06-12 DIAGNOSIS — T50905A Adverse effect of unspecified drugs, medicaments and biological substances, initial encounter: Secondary | ICD-10-CM

## 2024-06-12 DIAGNOSIS — H6123 Impacted cerumen, bilateral: Secondary | ICD-10-CM | POA: Diagnosis not present

## 2024-06-12 DIAGNOSIS — R2681 Unsteadiness on feet: Secondary | ICD-10-CM

## 2024-06-12 MED ORDER — VITAMIN D3 50 MCG (2000 UT) PO CAPS: 2000.0000 [IU] | ORAL_CAPSULE | Freq: Every day | ORAL | Status: AC

## 2024-06-12 MED ORDER — TORSEMIDE 20 MG PO TABS: 20.0000 mg | ORAL_TABLET | Freq: Every day | ORAL | Status: AC

## 2024-06-12 NOTE — Patient Instructions (Signed)
 Send me a message in a week if not significantly better with the unsteadiness.  Stay hydrated.  OK to use Debrox (peroxide) in the ear to loosen up wax. Also recommend using a bulb syringe (for removing boogers from baby's noses) to flush through warm water  and vinegar (3-4:1 ratio). An alternative, though more expensive, is an elephant ear washer wax removal kit. Do not use Q-tips as this can impact wax further.  Let us  know if you need anything.

## 2024-06-12 NOTE — Progress Notes (Signed)
 Chief Complaint  Patient presents with   Balance    Balance     Subjective: Patient is a 89 y.o. male here for balance issues.  Started a couple days ago. When he moves, there are times when he feels unsteady on his feed and has to catch himself or else he will fall. He has not fallen. Turning is more bothersome in this respect. He does not feel poorly. Wondering if it is related to his Toprol  XL 12.5 mg/d. BP's have been stable. No CP or SOB that is new. Strength is stable for him.   Past Medical History:  Diagnosis Date   BPH (benign prostatic hypertrophy)    GERD (gastroesophageal reflux disease)    HOH (hard of hearing)    both ears   Hyperlipidemia    Hypertension    Normal cardiac stress test 11/28/13   Treadmill   PVC (premature ventricular contraction)     Objective: BP 120/64 (BP Location: Left Arm, Patient Position: Sitting)   Pulse 83   Temp 98 F (36.7 C) (Oral)   Resp 16   Ht 6' 1 (1.854 m)   Wt 182 lb (82.6 kg)   SpO2 98%   BMI 24.01 kg/m  General: Awake, appears stated age Heart: RRR, no LE edema Lungs: CTAB, no rales, wheezes or rhonchi. No accessory muscle use Neuro: Gait is steady/cautious. Grip strength adequate. 4/5 strength throughout in LE's.  Ears: 100% obstructed w cerumen b/l.  Psych: Age appropriate judgment and insight, normal affect and mood  Assessment and Plan: Unsteadiness  Adverse effect of drug, initial encounter  Bilateral impacted cerumen  Could be deconditioning. PT if no better. Adverse effect of chronic med. Stop Toprol . Monitor BP at home. See how he does over the next week. Flush today. Home care instructions verbalized.  The patient voiced understanding and agreement to the plan.  Shellie Dials Greenwood, DO 06/12/24  2:36 PM

## 2024-06-16 ENCOUNTER — Encounter: Payer: Self-pay | Admitting: Family Medicine

## 2024-07-17 ENCOUNTER — Other Ambulatory Visit: Payer: Self-pay

## 2024-07-17 ENCOUNTER — Encounter: Payer: Self-pay | Admitting: Family Medicine

## 2024-07-17 DIAGNOSIS — I120 Hypertensive chronic kidney disease with stage 5 chronic kidney disease or end stage renal disease: Secondary | ICD-10-CM

## 2024-07-18 ENCOUNTER — Ambulatory Visit (INDEPENDENT_AMBULATORY_CARE_PROVIDER_SITE_OTHER)

## 2024-07-18 VITALS — BP 117/77 | Ht 72.0 in | Wt 170.0 lb

## 2024-07-18 DIAGNOSIS — Z2821 Immunization not carried out because of patient refusal: Secondary | ICD-10-CM

## 2024-07-18 DIAGNOSIS — Z0000 Encounter for general adult medical examination without abnormal findings: Secondary | ICD-10-CM

## 2024-07-18 NOTE — Patient Instructions (Signed)
 Steven Pearson , Thank you for taking time out of your busy schedule to complete your Annual Wellness Visit with me. I enjoyed our conversation and look forward to speaking with you again next year. I, as well as your care team,  appreciate your ongoing commitment to your health goals. Please review the following plan we discussed and let me know if I can assist you in the future. Your Game plan/ To Do List    Referrals: If you haven't heard from the office you've been referred to, please reach out to them at the phone provided.  none Follow up Visits: Next Medicare AWV with our clinical staff: patient declined   Have you seen your provider in the last 6 months (3 months if uncontrolled diabetes)? No Next Office Visit with your provider: 08/07/2024  Clinician Recommendations:  Aim for 30 minutes of exercise or brisk walking, 6-8 glasses of water , and 5 servings of fruits and vegetables each day. =      This is a list of the screening recommended for you and due dates:  Health Maintenance  Topic Date Due   DTaP/Tdap/Td vaccine (1 - Tdap) Never done   COVID-19 Vaccine (6 - 2024-25 season) 08/28/2023   Flu Shot  07/27/2024   Medicare Annual Wellness Visit  07/18/2025   Pneumococcal Vaccine for age over 75  Completed   Hepatitis B Vaccine  Aged Out   HPV Vaccine  Aged Out   Meningitis B Vaccine  Aged Out   Zoster (Shingles) Vaccine  Discontinued    Advanced directives: (Declined) Advance directive discussed with you today. Even though you declined this today, please call our office should you change your mind, and we can give you the proper paperwork for you to fill out. Advance Care Planning is important because it:  [x]  Makes sure you receive the medical care that is consistent with your values, goals, and preferences  [x]  It provides guidance to your family and loved ones and reduces their decisional burden about whether or not they are making the right decisions based on your  wishes.  Follow the link provided in your after visit summary or read over the paperwork we have mailed to you to help you started getting your Advance Directives in place. If you need assistance in completing these, please reach out to us  so that we can help you!  See attachments for Preventive Care and Fall Prevention Tips.

## 2024-07-18 NOTE — Progress Notes (Signed)
 Because this visit was a virtual/telehealth visit,  certain criteria was not obtained, such a blood pressure, CBG if applicable, and timed get up and go. Any medications not marked as taking were not mentioned during the medication reconciliation part of the visit. Any vitals not documented were not able to be obtained due to this being a telehealth visit or patient was unable to self-report a recent blood pressure reading due to a lack of equipment at home via telehealth. Vitals that have been documented are verbally provided by the patient.  This visit was performed by a medical professional under my direct supervision. I was immediately available for consultation/collaboration. I have reviewed and agree with the Annual Wellness Visit documentation.  Subjective:   Steven Pearson is a 89 y.o. who presents for a Medicare Wellness preventive visit.  As a reminder, Annual Wellness Visits don't include a physical exam, and some assessments may be limited, especially if this visit is performed virtually. We may recommend an in-person follow-up visit with your provider if needed.  Visit Complete: Virtual I connected with  Steven Pearson on 07/18/24 by a audio enabled telemedicine application and verified that I am speaking with the correct person using two identifiers.  Patient Location: Home  Provider Location: Home Office  I discussed the limitations of evaluation and management by telemedicine. The patient expressed understanding and agreed to proceed.  Vital Signs: Because this visit was a virtual/telehealth visit, some criteria may be missing or patient reported. Any vitals not documented were not able to be obtained and vitals that have been documented are patient reported.  VideoDeclined- This patient declined Librarian, academic. Therefore the visit was completed with audio only.  Persons Participating in Visit: Patient.  AWV Questionnaire: No: Patient Medicare AWV  questionnaire was not completed prior to this visit.  Cardiac Risk Factors include: advanced age (>57men, >68 women);male gender;hypertension;dyslipidemia     Objective:    Today's Vitals   07/18/24 0809  BP: 117/77  Weight: 170 lb (77.1 kg)  Height: 6' (1.829 m)   Body mass index is 23.06 kg/m.     07/18/2024    8:14 AM 10/18/2022    9:38 AM 01/24/2022   11:36 AM 09/02/2021    9:04 AM 03/06/2018    8:00 PM 03/01/2018   10:36 AM 05/14/2014   11:32 AM  Advanced Directives  Does Patient Have a Medical Advance Directive? Yes Yes Yes Yes Yes  Yes  Patient has advance directive, copy not in chart;Patient would not like information   Type of Advance Directive Healthcare Power of [place];Living will Healthcare Power of [place];Living will  Healthcare Power of [place];Living will Healthcare Power of [place];Living will Healthcare Power of [place];Living will   Does patient want to make changes to medical advance directive? No - Patient declined    No - Patient declined  No - Patient declined    Copy of Healthcare Power of Attorney in Chart? No - copy requested No - copy requested  No - copy requested No - copy requested  No - copy requested       Data saved with a previous flowsheet row definition    Current Medications (verified) Outpatient Encounter Medications as of 07/18/2024  Medication Sig   allopurinol  (ZYLOPRIM ) 100 MG tablet TAKE 1 TABLET DAILY   atorvastatin  (LIPITOR) 40 MG tablet TAKE 1 TABLET DAILY   Cholecalciferol (VITAMIN D3) 50 MCG (2000 UT) capsule Take 1 capsule (2,000 Units total) by mouth daily.   empagliflozin  (  JARDIANCE ) 10 MG TABS tablet Take 1 tablet (10 mg total) by mouth daily before breakfast.   omeprazole  (PRILOSEC) 10 MG capsule TAKE 1 CAPSULE DAILY   torsemide  (DEMADEX ) 20 MG tablet Take 1 tablet (20 mg total) by mouth daily.   No facility-administered encounter medications on file as of 07/18/2024.    Allergies (verified) Nitrofuran derivatives    History: Past Medical History:  Diagnosis Date   BPH (benign prostatic hypertrophy)    GERD (gastroesophageal reflux disease)    HOH (hard of hearing)    both ears   Hyperlipidemia    Hypertension    Normal cardiac stress test 11/28/13   Treadmill   PVC (premature ventricular contraction)    Past Surgical History:  Procedure Laterality Date   basal cell skin areas removed     more than 1 removed   CATARACT EXTRACTION, BILATERAL     left eye poor vision now   CYSTOSCOPY N/A 03/06/2018   Procedure: CYSTOSCOPY;  Surgeon: Renda Glance, MD;  Location: [facility];  Service: Urology;  Laterality: N/A;   HAND SURGERY     right   squamous cell skin pre cancer areas removed     x 1   TONSILLECTOMY     TRANSURETHRAL RESECTION OF PROSTATE N/A 03/06/2018   Procedure: TRANSURETHRAL RESECTION OF THE PROSTATE (TURP);  Surgeon: Renda Glance, MD;  Location: [facility];  Service: Urology;  Laterality: N/A;   TRANSURETHRAL RESECTION OF PROSTATE  01/06/2021   Family History  Problem Relation Age of Onset   Heart Problems Mother    Heart disease Father        Pacemaker   Social History   Socioeconomic History   Marital status: Widowed    Spouse name: Not on file   Number of children: 2   Years of education: Not on file   Highest education level: Not on file  Occupational History   Not on file  Tobacco Use   Smoking status: Former    Current packs/day: 0.00    Average packs/day: 1.5 packs/day for 45.0 years (67.5 ttl pk-yrs)    Types: Cigarettes    Start date: 08/16/1954    Quit date: 08/17/1999    Years since quitting: 24.9   Smokeless tobacco: Never  Vaping Use   Vaping status: Never Used  Substance and Sexual Activity   Alcohol use: Yes    Alcohol/week: 5.0 standard drinks of alcohol    Types: 5 Glasses of wine per week    Comment: glass of wine per day   Drug use: No   Sexual activity: Not Currently  Other Topics Concern   Not on file  Social History Narrative   Not on file    Social Drivers of Health   Financial Resource Strain: Low Risk  (07/18/2024)   Overall Financial Resource Strain (CARDIA)    Difficulty of Paying Living Expenses: Not hard at all  Food Insecurity: No Food Insecurity (07/18/2024)   Hunger Vital Sign    Worried About Running Out of Food in the Last Year: Never true    Ran Out of Food in the Last Year: Never true  Transportation Needs: No Transportation Needs (07/18/2024)   PRAPARE - Administrator, Civil Service (Medical): No    Lack of Transportation (Non-Medical): No  Physical Activity: Inactive (07/18/2024)   Exercise Vital Sign    Days of Exercise per Week: 0 days    Minutes of Exercise per Session: 0 min  Stress: No  Stress Concern Present (07/18/2024)   [employer] of Occupational Health - Occupational Stress Questionnaire    Feeling of Stress: Not at all  Social Connections: Socially Isolated (07/18/2024)   Social Connection and Isolation Panel    Frequency of Communication with Friends and Family: More than three times a week    Frequency of Social Gatherings with Friends and Family: More than three times a week    Attends Religious Services: Never    Database administrator or Organizations: No    Attends Banker Meetings: Never    Marital Status: Widowed    Tobacco Counseling Counseling given: Not Answered    Clinical Intake:  Pre-visit preparation completed: Yes  Pain : No/denies pain     BMI - recorded: 23.06 Nutritional Status: BMI of 19-24  Normal Nutritional Risks: None Diabetes: No  Lab Results  Component Value Date   HGBA1C 5.8 07/28/2023   HGBA1C 6.0 07/07/2022   HGBA1C 5.4 08/20/2020     How often do you need to have someone help you when you read instructions, pamphlets, or other written materials from your doctor or pharmacy?: 1 - Never What is the last grade level you completed in school?: Some college  Interpreter Needed?: No  Information entered by :: Steven Kamp  Pearson,CMA   Activities of Daily Living     07/18/2024    8:13 AM  In your present state of health, do you have any difficulty performing the following activities:  Hearing? 0  Vision? 0  Difficulty concentrating or making decisions? 0  Walking or climbing stairs? 0  Dressing or bathing? 0  Doing errands, shopping? 0  Preparing Food and eating ? N  Using the Toilet? N  In the past six months, have you accidently leaked urine? N  Do you have problems with loss of bowel control? N  Managing your Medications? N  Managing your Finances? N  Housekeeping or managing your Housekeeping? N    Patient Care Team: Frann Mabel Mt, DO as PCP - General (Family Medicine)  I have updated your Care Teams any recent Medical Services you may have received from other providers in the past year.     Assessment:   This is a routine wellness examination for Steven Pearson.  Hearing/Vision screen Hearing Screening - Comments:: Some difficulties but no hearing aids Vision Screening - Comments:: Patient wears glasses   Goals Addressed             This Visit's Progress    Patient Stated   On track    Maintain health        Depression Screen     07/18/2024    8:16 AM 06/12/2024    1:21 PM 02/08/2024    9:54 AM 10/18/2022    9:37 AM 01/11/2022    9:14 AM 09/02/2021    9:10 AM 07/20/2021    9:06 AM  PHQ 2/9 Scores  PHQ - 2 Score 0 0 0 0 0 0 0  PHQ- 9 Score 0          Fall Risk     07/18/2024    8:14 AM 06/12/2024    1:21 PM 02/08/2024    9:54 AM 01/19/2023   10:37 AM 10/18/2022    9:39 AM  Fall Risk   Falls in the past year? 0 0 0 0 0  Number falls in past yr: 0 0 0 0 0  Injury with Fall? 0 0 0 0 0  Risk for  fall due to : No Fall Risks    Impaired vision  Follow up Falls evaluation completed Falls evaluation completed Falls evaluation completed Falls evaluation completed  Falls prevention discussed      Data saved with a previous flowsheet row definition    MEDICARE RISK AT  HOME:  Medicare Risk at Home Any stairs in or around the home?: Yes If so, are there any without handrails?: No Home free of loose throw rugs in walkways, pet beds, electrical cords, etc?: Yes Adequate lighting in your home to reduce risk of falls?: Yes Life alert?: Yes Use of a cane, walker or w/c?: No Grab bars in the bathroom?: Yes Shower chair or bench in shower?: Yes Elevated toilet seat or a handicapped toilet?: Yes  TIMED UP AND GO:  Was the test performed?  No  Cognitive Function: 6CIT completed        07/18/2024    8:12 AM 10/18/2022    9:40 AM 10/05/2017   10:22 AM  6CIT Screen  What Year? 0 points 0 points 0 points  What month? 0 points 0 points 0 points  What time? 0 points 0 points 0 points  Count back from 20 0 points 0 points 0 points  Months in reverse 0 points 0 points 0 points  Repeat phrase 0 points 0 points 4 points  Total Score 0 points 0 points 4 points    Immunizations Immunization History  Administered Date(s) Administered   Influenza Split 09/20/2012, 09/24/2013   Influenza, High Dose Seasonal PF 10/05/2017, 09/20/2018, 09/26/2019   Influenza,inj,Quad PF,6+ Mos 09/24/2016   Influenza-Unspecified 09/20/2012, 09/24/2013, 09/26/2014, 09/27/2015, 09/24/2016, 09/26/2018, 11/11/2021, 10/13/2022   Moderna Covid-19 Vaccine  Bivalent Booster 78yrs & up 10/13/2022   Moderna Sars-Covid-2 Vaccination 01/11/2020, 02/11/2020, 10/29/2020, 07/08/2021   Pneumococcal Conjugate-13 10/05/2017   Pneumococcal Polysaccharide-23 09/20/2012    Screening Tests Health Maintenance  Topic Date Due   DTaP/Tdap/Td (1 - Tdap) Never done   COVID-19 Vaccine (6 - 2024-25 season) 08/28/2023   INFLUENZA VACCINE  07/27/2024   Medicare Annual Wellness (AWV)  07/18/2025   Pneumococcal Vaccine: 50+ Years  Completed   Hepatitis B Vaccines  Aged Out   HPV VACCINES  Aged Out   Meningococcal B Vaccine  Aged Out   Zoster Vaccines- Shingrix  Discontinued    Health  Maintenance  Health Maintenance Due  Topic Date Due   DTaP/Tdap/Td (1 - Tdap) Never done   COVID-19 Vaccine (6 - 2024-25 season) 08/28/2023   Health Maintenance Items Addressed:patient declined vaccination  Additional Screening:  Vision Screening: Recommended annual ophthalmology exams for early detection of glaucoma and other disorders of the eye. Would you like a referral to an eye doctor? No    Dental Screening: Recommended annual dental exams for proper oral hygiene  Community Resource Referral / Chronic Care Management: CRR required this visit?  No   CCM required this visit?  No   Plan:    I have personally reviewed and noted the following in the patient's chart:   Medical and social history Use of alcohol, tobacco or illicit drugs  Current medications and supplements including opioid prescriptions. Patient is not currently taking opioid prescriptions. Functional ability and status Nutritional status Physical activity Advanced directives List of other physicians Hospitalizations, surgeries, and ER visits in previous 12 months Vitals Screenings to include cognitive, depression, and falls Referrals and appointments  In addition, I have reviewed and discussed with patient certain preventive protocols, quality metrics, and best practice recommendations. A written personalized  care plan for preventive services as well as general preventive health recommendations were provided to patient.   Lyle MARLA Right, [place]   07/18/2024   After Visit Summary: (MyChart) Due to this being a telephonic visit, the after visit summary with patients personalized plan was offered to patient via MyChart   Notes: Nothing significant to report at this time.

## 2024-07-23 ENCOUNTER — Encounter: Payer: Self-pay | Admitting: Family Medicine

## 2024-07-23 DIAGNOSIS — E785 Hyperlipidemia, unspecified: Secondary | ICD-10-CM

## 2024-07-23 DIAGNOSIS — E559 Vitamin D deficiency, unspecified: Secondary | ICD-10-CM

## 2024-07-23 DIAGNOSIS — I10 Essential (primary) hypertension: Secondary | ICD-10-CM

## 2024-07-23 DIAGNOSIS — I120 Hypertensive chronic kidney disease with stage 5 chronic kidney disease or end stage renal disease: Secondary | ICD-10-CM

## 2024-07-23 DIAGNOSIS — R7303 Prediabetes: Secondary | ICD-10-CM

## 2024-07-31 ENCOUNTER — Other Ambulatory Visit: Payer: Self-pay

## 2024-07-31 ENCOUNTER — Other Ambulatory Visit (INDEPENDENT_AMBULATORY_CARE_PROVIDER_SITE_OTHER)

## 2024-07-31 DIAGNOSIS — I129 Hypertensive chronic kidney disease with stage 1 through stage 4 chronic kidney disease, or unspecified chronic kidney disease: Secondary | ICD-10-CM | POA: Diagnosis not present

## 2024-07-31 DIAGNOSIS — I10 Essential (primary) hypertension: Secondary | ICD-10-CM

## 2024-07-31 DIAGNOSIS — N1832 Chronic kidney disease, stage 3b: Secondary | ICD-10-CM | POA: Diagnosis not present

## 2024-07-31 DIAGNOSIS — I120 Hypertensive chronic kidney disease with stage 5 chronic kidney disease or end stage renal disease: Secondary | ICD-10-CM | POA: Diagnosis not present

## 2024-07-31 DIAGNOSIS — N185 Chronic kidney disease, stage 5: Secondary | ICD-10-CM | POA: Diagnosis not present

## 2024-07-31 DIAGNOSIS — N183 Chronic kidney disease, stage 3 unspecified: Secondary | ICD-10-CM | POA: Diagnosis not present

## 2024-07-31 DIAGNOSIS — R7303 Prediabetes: Secondary | ICD-10-CM

## 2024-07-31 DIAGNOSIS — E559 Vitamin D deficiency, unspecified: Secondary | ICD-10-CM | POA: Diagnosis not present

## 2024-07-31 DIAGNOSIS — E785 Hyperlipidemia, unspecified: Secondary | ICD-10-CM | POA: Diagnosis not present

## 2024-07-31 LAB — LIPID PANEL
Cholesterol: 147 mg/dL (ref 0–200)
HDL: 57 mg/dL (ref >39.00)
LDL Cholesterol: 73 mg/dL (ref 0–99)
NonHDL: 90.25
Total CHOL/HDL Ratio: 3
Triglycerides: 87 mg/dL (ref 0.0–149.0)
VLDL: 17.4 mg/dL (ref 0.0–40.0)

## 2024-07-31 LAB — CBC WITH DIFFERENTIAL/PLATELET
Basophils Absolute: 0 K/uL (ref 0.0–0.1)
Basophils Relative: 0.7 % (ref 0.0–3.0)
Eosinophils Absolute: 0.1 K/uL (ref 0.0–0.7)
Eosinophils Relative: 2.2 % (ref 0.0–5.0)
HCT: 43.9 % (ref 39.0–52.0)
Hemoglobin: 14.5 g/dL (ref 13.0–17.0)
Lymphocytes Relative: 33.1 % (ref 12.0–46.0)
Lymphs Abs: 1.5 K/uL (ref 0.7–4.0)
MCHC: 33 g/dL (ref 30.0–36.0)
MCV: 96.5 fl (ref 78.0–100.0)
Monocytes Absolute: 0.4 K/uL (ref 0.1–1.0)
Monocytes Relative: 8.2 % (ref 3.0–12.0)
Neutro Abs: 2.6 K/uL (ref 1.4–7.7)
Neutrophils Relative %: 55.8 % (ref 43.0–77.0)
Platelets: 147 K/uL — ABNORMAL LOW (ref 150.0–400.0)
RBC: 4.55 Mil/uL (ref 4.22–5.81)
RDW: 14.2 % (ref 11.5–15.5)
WBC: 4.6 K/uL (ref 4.0–10.5)

## 2024-07-31 LAB — COMPREHENSIVE METABOLIC PANEL WITH GFR
ALT: 11 U/L (ref 0–53)
AST: 14 U/L (ref 0–37)
Albumin: 4.2 g/dL (ref 3.5–5.2)
Alkaline Phosphatase: 64 U/L (ref 39–117)
BUN: 30 mg/dL — ABNORMAL HIGH (ref 6–23)
CO2: 32 meq/L (ref 19–32)
Calcium: 9.2 mg/dL (ref 8.4–10.5)
Chloride: 100 meq/L (ref 96–112)
Creatinine, Ser: 1.88 mg/dL — ABNORMAL HIGH (ref 0.40–1.50)
GFR: 31.22 mL/min — ABNORMAL LOW (ref >60.00)
Glucose, Bld: 101 mg/dL — ABNORMAL HIGH (ref 70–99)
Potassium: 4.1 meq/L (ref 3.5–5.1)
Sodium: 138 meq/L (ref 135–145)
Total Bilirubin: 0.9 mg/dL (ref 0.2–1.2)
Total Protein: 6.8 g/dL (ref 6.0–8.3)

## 2024-07-31 LAB — VITAMIN D 25 HYDROXY (VIT D DEFICIENCY, FRACTURES): VITD: 51.62 ng/mL (ref 30.00–100.00)

## 2024-07-31 LAB — RENAL FUNCTION PANEL
Albumin: 4.2 g/dL (ref 3.5–5.2)
BUN: 30 mg/dL — ABNORMAL HIGH (ref 6–23)
CO2: 32 meq/L (ref 19–32)
Calcium: 9.2 mg/dL (ref 8.4–10.5)
Chloride: 100 meq/L (ref 96–112)
Creatinine, Ser: 1.88 mg/dL — ABNORMAL HIGH (ref 0.40–1.50)
GFR: 31.22 mL/min — ABNORMAL LOW (ref >60.00)
Glucose, Bld: 101 mg/dL — ABNORMAL HIGH (ref 70–99)
Phosphorus: 3.3 mg/dL (ref 2.3–4.6)
Potassium: 4.1 meq/L (ref 3.5–5.1)
Sodium: 138 meq/L (ref 135–145)

## 2024-07-31 LAB — URIC ACID: Uric Acid, Serum: 6 mg/dL (ref 4.0–7.8)

## 2024-07-31 LAB — HEMOGLOBIN A1C: Hgb A1c MFr Bld: 6.2 % (ref 4.6–6.5)

## 2024-07-31 NOTE — Addendum Note (Signed)
 Addended by: TRUDY CURVIN RAMAN on: 07/31/2024 09:12 AM   Modules accepted: Orders

## 2024-08-01 LAB — PARATHYROID HORMONE, INTACT (NO CA): PTH: 52 pg/mL (ref 16–77)

## 2024-08-02 ENCOUNTER — Ambulatory Visit: Payer: Self-pay | Admitting: Family Medicine

## 2024-08-07 ENCOUNTER — Ambulatory Visit: Payer: Medicare Other | Admitting: Family Medicine

## 2024-08-07 ENCOUNTER — Encounter: Payer: Self-pay | Admitting: Family Medicine

## 2024-08-07 VITALS — BP 124/70 | HR 81 | Temp 98.0°F | Resp 16 | Ht 73.0 in | Wt 179.0 lb

## 2024-08-07 DIAGNOSIS — M1A00X0 Idiopathic chronic gout, unspecified site, without tophus (tophi): Secondary | ICD-10-CM

## 2024-08-07 DIAGNOSIS — E7800 Pure hypercholesterolemia, unspecified: Secondary | ICD-10-CM

## 2024-08-07 NOTE — Progress Notes (Signed)
 Chief Complaint  Patient presents with   Follow-up    Follow Up    Subjective: Hyperlipidemia Patient presents for Hyperlipidemia follow up. Currently taking Lipitor 40 mg/d and compliance with treatment thus far has been good. He denies myalgias. He is adhering to a healthy diet. Exercise: some walking No Cp or SOB.  The patient is not known to have coexisting coronary artery disease.  Gout No recent gout flares. Taking allopurinol  100 mg/d. No problems w meds. Compliant.   Past Medical History:  Diagnosis Date   BPH (benign prostatic hypertrophy)    GERD (gastroesophageal reflux disease)    HOH (hard of hearing)    both ears   Hyperlipidemia    Hypertension    Normal cardiac stress test 11/28/13   Treadmill   PVC (premature ventricular contraction)     Objective: BP 124/70 (BP Location: Left Arm, Patient Position: Sitting)   Pulse 81   Temp 98 F (36.7 C) (Oral)   Resp 16   Ht 6' 1 (1.854 m)   Wt 179 lb (81.2 kg)   SpO2 96%   BMI 23.62 kg/m  General: Awake, appears stated age Heart: RRR, 1+ lateral LE edema tapering at the knees, no bruits Lungs: CTAB, no rales, wheezes or rhonchi. No accessory muscle use Psych: Age appropriate judgment and insight, normal affect and mood  Assessment and Plan: Pure hypercholesterolemia  Idiopathic chronic gout without tophus, unspecified site  Chronic, stable.  Continue Lipitor 40 mg daily. Chronic, stable.  Continue allopurinol  100 mg daily. The patient brought up a frustrating situation where there were miscommunications using MyChart.  He requested my personal cell phone number.  I politely declined.  I will see if he is able to send me directly MyChart messages where the staff just forwards them to me.  Will see if we can make a note in his chart.  If there are still issues, he will likely find a new physician.  We will bridge any medications he may need until he sees that new physician if needed. F/u in 6 months, labs 1  week prior. The patient voiced understanding and agreement to the plan.  Mabel Mt [place], DO 08/07/24  12:23 PM

## 2024-08-07 NOTE — Patient Instructions (Signed)
 Keep the diet clean and stay active.  In MyChart messages, request your message be sent to me.   Let us  know if you need anything.

## 2024-08-08 DIAGNOSIS — I129 Hypertensive chronic kidney disease with stage 1 through stage 4 chronic kidney disease, or unspecified chronic kidney disease: Secondary | ICD-10-CM | POA: Diagnosis not present

## 2024-08-08 DIAGNOSIS — E559 Vitamin D deficiency, unspecified: Secondary | ICD-10-CM | POA: Diagnosis not present

## 2024-08-08 DIAGNOSIS — N183 Chronic kidney disease, stage 3 unspecified: Secondary | ICD-10-CM | POA: Diagnosis not present

## 2024-08-08 DIAGNOSIS — M898X9 Other specified disorders of bone, unspecified site: Secondary | ICD-10-CM | POA: Diagnosis not present

## 2024-08-08 DIAGNOSIS — R809 Proteinuria, unspecified: Secondary | ICD-10-CM | POA: Diagnosis not present

## 2024-08-20 ENCOUNTER — Encounter: Payer: Self-pay | Admitting: Skilled Nursing Facility1

## 2024-08-20 ENCOUNTER — Encounter: Attending: Family Medicine | Admitting: Skilled Nursing Facility1

## 2024-08-20 DIAGNOSIS — N1832 Chronic kidney disease, stage 3b: Secondary | ICD-10-CM | POA: Diagnosis not present

## 2024-08-20 NOTE — Progress Notes (Signed)
 Medical Nutrition Therapy  Appointment Start time:  10:18  Appointment End time:  11:01  Primary concerns today: Kidney Health  Referral diagnosis: i12  Appointment conducted via telephone: pt identified by name and DOB, pt agreeable to limitations of visit type   NUTRITION ASSESSMENT    Clinical Medical Hx: GERD, hyperlipidemia, HTN Medications: see list Labs: creatinine 1.88, BUN 30, glucose 101, GFR 31.22, A1C 6.2 Notable Signs/Symptoms: none reported   Lifestyle & Dietary Hx  Pt states he will return after his next renal labs to see if there are any other dietary tweaks to make.   Estimated daily fluid intake:  oz Supplements:  Sleep:  Stress / self-care:  Current average weekly physical activity: ADL's  24-Hr Dietary Recall: counting calories 1200-1500 First Meal:  Snack:  Second Meal: cheesecake or pie Snack: warthers caramels Third Meal: oatmeal  Snack:  Beverages: 2-3 coffee, 8-10 ounces ice water , sips of ice tea, 4-5 ounces water    NUTRITION INTERVENTION  Nutrition education (E-1) on the following topics:  Provided medical nutrition therapy regarding the role of adequate energy intake in preventing protein-energy wasting, sarcopenia, and unintentional weight loss in older adults with CKD stage 3 (per KDOQI 2020 nutrition guidelines). Reinforced importance of maintaining caloric intake of ~30 kcal/kg/day (adjusted for clinical status, weight history, and comorbidities) to preserve lean body mass and functional status. Discussed strategies to optimize oral intake including small, frequent meals; incorporation of calorie-dense, nutrient-appropriate foods (e.g., unsaturated fats, fortified foods, oral nutrition supplements if clinically indicated). Reviewed protein recommendations (0.55-0.6 g/kg/day for stable CKD stage 3 per KDOQI, adjusted as appropriate to meet nutritional needs while preventing catabolism). Emphasized minimizing intake of foods high in sodium,  phosphorus, and potassium per renal diet guidelines, with adjustments based on laboratory monitoring, while prioritizing energy adequacy. Patient demonstrated comprehension via teach-back and expressed willingness to trial snacks between meals and calorie-enhancing methods such as nuts or peanut butter instead of warthers    Learning Style & Readiness for Change Teaching method utilized: Visual & Auditory  Demonstrated degree of understanding via: Teach Back     MONITORING & EVALUATION Dietary intake, weekly physical activity  Next Steps  Patient is to fallow up after renal labs taken about 6 months from now.

## 2024-08-21 ENCOUNTER — Encounter: Payer: Self-pay | Admitting: Family Medicine

## 2024-09-06 ENCOUNTER — Telehealth: Payer: Self-pay | Admitting: Skilled Nursing Facility1

## 2024-09-06 ENCOUNTER — Encounter: Payer: Self-pay | Admitting: Skilled Nursing Facility1

## 2024-09-06 NOTE — Telephone Encounter (Signed)
 Returned pts call.  Pt would like dietitian to Send an attachment of nutrition targets for sodium and protein and all vitamins and minerals appropriate for him.

## 2024-10-05 DIAGNOSIS — Z23 Encounter for immunization: Secondary | ICD-10-CM | POA: Diagnosis not present

## 2024-10-06 ENCOUNTER — Other Ambulatory Visit: Payer: Self-pay | Admitting: Family Medicine

## 2024-10-24 ENCOUNTER — Encounter: Payer: Self-pay | Admitting: Cardiology

## 2024-11-28 DIAGNOSIS — N400 Enlarged prostate without lower urinary tract symptoms: Secondary | ICD-10-CM | POA: Diagnosis not present

## 2024-12-29 ENCOUNTER — Other Ambulatory Visit: Payer: Self-pay | Admitting: Family Medicine

## 2024-12-29 DIAGNOSIS — I700 Atherosclerosis of aorta: Secondary | ICD-10-CM

## 2025-01-22 ENCOUNTER — Other Ambulatory Visit: Payer: Self-pay | Admitting: Family Medicine

## 2025-01-22 ENCOUNTER — Encounter: Payer: Self-pay | Admitting: Family Medicine

## 2025-01-22 DIAGNOSIS — M1A00X0 Idiopathic chronic gout, unspecified site, without tophus (tophi): Secondary | ICD-10-CM

## 2025-01-22 DIAGNOSIS — E7800 Pure hypercholesterolemia, unspecified: Secondary | ICD-10-CM

## 2025-01-22 DIAGNOSIS — N1832 Chronic kidney disease, stage 3b: Secondary | ICD-10-CM

## 2025-01-22 DIAGNOSIS — E559 Vitamin D deficiency, unspecified: Secondary | ICD-10-CM

## 2025-01-22 DIAGNOSIS — R7303 Prediabetes: Secondary | ICD-10-CM

## 2025-01-23 ENCOUNTER — Other Ambulatory Visit: Payer: Self-pay | Admitting: Family Medicine

## 2025-01-23 DIAGNOSIS — N1832 Chronic kidney disease, stage 3b: Secondary | ICD-10-CM

## 2025-02-05 ENCOUNTER — Ambulatory Visit: Admitting: Family Medicine

## 2025-02-12 ENCOUNTER — Ambulatory Visit: Admitting: Family Medicine
